# Patient Record
Sex: Female | Born: 1950 | Hispanic: No | Smoking: Former smoker
Health system: Southern US, Community
[De-identification: ages and names within clinical notes are randomized; demographics above are authoritative.]

## PROBLEM LIST (undated history)

## (undated) DIAGNOSIS — F32A Depression, unspecified: Secondary | ICD-10-CM

## (undated) DIAGNOSIS — G4730 Sleep apnea, unspecified: Secondary | ICD-10-CM

## (undated) DIAGNOSIS — E7800 Pure hypercholesterolemia, unspecified: Secondary | ICD-10-CM

## (undated) DIAGNOSIS — M1990 Unspecified osteoarthritis, unspecified site: Secondary | ICD-10-CM

## (undated) DIAGNOSIS — Z9889 Other specified postprocedural states: Secondary | ICD-10-CM

## (undated) DIAGNOSIS — I10 Essential (primary) hypertension: Secondary | ICD-10-CM

## (undated) HISTORY — PX: BREAST SURGERY: SHX581

## (undated) HISTORY — PX: TOTAL HIP ARTHROPLASTY: SHX124

## (undated) HISTORY — PX: MENISCUS REPAIR: SHX5179

## (undated) HISTORY — PX: TREATMENT FISTULA ANAL: SUR1390

## (undated) HISTORY — PX: APPENDECTOMY: SHX54

---

## 1999-11-27 ENCOUNTER — Other Ambulatory Visit: Admission: RE | Admit: 1999-11-27 | Discharge: 1999-11-27 | Payer: Self-pay | Admitting: Plastic Surgery

## 2017-03-31 DIAGNOSIS — Z01818 Encounter for other preprocedural examination: Secondary | ICD-10-CM

## 2020-01-04 ENCOUNTER — Other Ambulatory Visit: Payer: Self-pay | Admitting: Neurological Surgery

## 2020-01-11 ENCOUNTER — Encounter (HOSPITAL_COMMUNITY): Payer: Self-pay

## 2020-01-11 ENCOUNTER — Other Ambulatory Visit: Payer: Self-pay

## 2020-01-11 ENCOUNTER — Encounter (HOSPITAL_COMMUNITY)
Admission: RE | Admit: 2020-01-11 | Discharge: 2020-01-11 | Disposition: A | Discharge status: Home / Self Care | Payer: Medicare PPO | Source: Ambulatory Visit | Attending: Neurological Surgery | Admitting: Neurological Surgery

## 2020-01-11 DIAGNOSIS — Z01818 Encounter for other preprocedural examination: Secondary | ICD-10-CM | POA: Insufficient documentation

## 2020-01-11 HISTORY — DX: Sleep apnea, unspecified: G47.30

## 2020-01-11 HISTORY — DX: Depression, unspecified: F32.A

## 2020-01-11 HISTORY — DX: Unspecified osteoarthritis, unspecified site: M19.90

## 2020-01-11 HISTORY — DX: Other specified postprocedural states: Z98.890

## 2020-01-11 HISTORY — DX: Pure hypercholesterolemia, unspecified: E78.00

## 2020-01-11 HISTORY — DX: Essential (primary) hypertension: I10

## 2020-01-11 LAB — CBC
HCT: 41.8 % (ref 36.0–46.0)
Hemoglobin: 14.1 g/dL (ref 12.0–15.0)
MCH: 30.1 pg (ref 26.0–34.0)
MCHC: 33.7 g/dL (ref 30.0–36.0)
MCV: 89.1 fL (ref 80.0–100.0)
Platelets: 177 10*3/uL (ref 150–400)
RBC: 4.69 MIL/uL (ref 3.87–5.11)
RDW: 12.7 % (ref 11.5–15.5)
WBC: 7.9 10*3/uL (ref 4.0–10.5)
nRBC: 0 % (ref 0.0–0.2)

## 2020-01-11 LAB — TYPE AND SCREEN
ABO/RH(D): O POS
Antibody Screen: NEGATIVE

## 2020-01-11 LAB — BASIC METABOLIC PANEL
Anion gap: 10 (ref 5–15)
BUN: 22 mg/dL (ref 8–23)
CO2: 22 mmol/L (ref 22–32)
Calcium: 10 mg/dL (ref 8.9–10.3)
Chloride: 107 mmol/L (ref 98–111)
Creatinine, Ser: 0.64 mg/dL (ref 0.44–1.00)
GFR calc Af Amer: >60 mL/min (ref >60)
GFR calc non Af Amer: >60 mL/min (ref >60)
Glucose, Bld: 97 mg/dL (ref 70–99)
Potassium: 4.2 mmol/L (ref 3.5–5.1)
Sodium: 139 mmol/L (ref 135–145)

## 2020-01-11 LAB — SURGICAL PCR SCREEN
MRSA, PCR: NEGATIVE
Staphylococcus aureus: POSITIVE — AB

## 2020-01-11 NOTE — Progress Notes (Signed)
No Pharmacies Listed     Your procedure is scheduled on Monday, July 26th.  Report to [facility] Main Entrance "A" at 10:45 A.M., and check in at the Admitting office.  Call this number if you have problems the morning of surgery:  [phone]  Call [phone] if you have any questions prior to your surgery date Monday-Friday 8am-4pm    Remember:  Do not eat or drink after midnight the night before your surgery      Take these medicines the morning of surgery with A SIP OF WATER   Tylenol - if needed  Escitalopram (Lexapro)  Gabapentin (Neurontin)  Omeprazole (prilosec)   Rosuvastatin (Crestor)  As of today, STOP taking any Aspirin (unless otherwise instructed by your surgeon) Aleve, Naproxen, Ibuprofen, Motrin, Advil, Goody's, BC's, all herbal medications, fish oil, and all vitamins.                      Do not wear jewelry, make up, or nail polish            Do not wear lotions, powders, perfumes, or deodorant.            Do not shave 48 hours prior to surgery.              Do not bring valuables to the hospital.            [facility] is not responsible for any belongings or valuables.  Do NOT Smoke (Tobacco/Vaping) or drink Alcohol 24 hours prior to your procedure If you use a CPAP at night, you may bring all equipment for your overnight stay.   Contacts, glasses, dentures or bridgework may not be worn into surgery.      For patients admitted to the hospital, discharge time will be determined by your treatment team.   Patients discharged the day of surgery will not be allowed to drive home, and someone needs to stay with them for 24 hours.    Special instructions:   [facility]- Preparing For Surgery  Before surgery, you can play an important role. Because skin is not sterile, your skin needs to be as free of germs as possible. You can reduce the number of germs on your skin by washing with CHG (chlorahexidine gluconate) Soap before surgery.  CHG is an antiseptic  cleaner which kills germs and bonds with the skin to continue killing germs even after washing.    Oral Hygiene is also important to reduce your risk of infection.  Remember - BRUSH YOUR TEETH THE MORNING OF SURGERY WITH YOUR REGULAR TOOTHPASTE  Please do not use if you have an allergy to CHG or antibacterial soaps. If your skin becomes reddened/irritated stop using the CHG.  Do not shave (including legs and underarms) for at least 48 hours prior to first CHG shower. It is OK to shave your face.  Please follow these instructions carefully.   1. Shower the NIGHT BEFORE SURGERY and the MORNING OF SURGERY with CHG Soap.   2. If you chose to wash your hair, wash your hair first as usual with your normal shampoo.  3. After you shampoo, rinse your hair and body thoroughly to remove the shampoo.  4. Use CHG as you would any other liquid soap. You can apply CHG directly to the skin and wash gently with a scrungie or a clean washcloth.   5. Apply the CHG Soap to your body ONLY FROM THE NECK DOWN.  Do not  use on open wounds or open sores. Avoid contact with your eyes, ears, mouth and genitals (private parts). Wash Face and genitals (private parts)  with your normal soap.   6. Wash thoroughly, paying special attention to the area where your surgery will be performed.  7. Thoroughly rinse your body with warm water from the neck down.  8. DO NOT shower/wash with your normal soap after using and rinsing off the CHG Soap.  9. Pat yourself dry with a CLEAN TOWEL.  10. Wear CLEAN PAJAMAS to bed the night before surgery  11. Place CLEAN SHEETS on your bed the night of your first shower and DO NOT SLEEP WITH PETS.   Day of Surgery: Wear Clean/Comfortable clothing the morning of surgery Do not apply any deodorants/lotions.   Remember to brush your teeth WITH YOUR REGULAR TOOTHPASTE.   Please read over the following fact sheets that you were given.

## 2020-01-11 NOTE — Progress Notes (Addendum)
PCP - Mauricio Po  Chest x-ray - n/a EKG - 01-11-20  SA - yes, does not wear CPAP   COVID TEST- Saturday 01-17-20   Anesthesia review: yes, EKG Revonda Standard notified at PAT appointment, chart sent to anesthesia  Patient denies shortness of breath, fever, cough and chest pain at PAT appointment   All instructions explained to the patient, with a verbal understanding of the material. Patient agrees to go over the instructions while at home for a better understanding. Patient also instructed to self quarantine after being tested for COVID-19. The opportunity to ask questions was provided.

## 2020-01-12 NOTE — Anesthesia Preprocedure Evaluation (Addendum)
Anesthesia Evaluation  Patient identified by MRN, date of birth, ID band Patient awake    Airway Mallampati: I  TM Distance: >3 FB     Dental  (+) Missing, Dental Advisory Given   Pulmonary sleep apnea , former smoker,    breath sounds clear to auscultation       Cardiovascular hypertension,  Rhythm:Regular     Neuro/Psych Depression    GI/Hepatic negative GI ROS, Neg liver ROS,   Endo/Other    Renal/GU negative Renal ROS     Musculoskeletal   Abdominal   Peds  Hematology   Anesthesia Other Findings   Reproductive/Obstetrics                          Anesthesia Physical Anesthesia Plan  ASA: III  Anesthesia Plan: General   Post-op Pain Management:    Induction:   PONV Risk Score and Plan: Ondansetron, Dexamethasone and Treatment may vary due to age or medical condition  Airway Management Planned: Oral ETT  Additional Equipment:   Intra-op Plan:   Post-operative Plan: Extubation in OR  Informed Consent: I have reviewed the patients History and Physical, chart, labs and discussed the procedure including the risks, benefits and alternatives for the proposed anesthesia with the patient or authorized representative who has indicated his/her understanding and acceptance.     Dental advisory given  Plan Discussed with: CRNA and Anesthesiologist  Anesthesia Plan Comments: (PAT note written 01/12/2020 by Shonna Chock, PA-C. )      Anesthesia Quick Evaluation

## 2020-01-12 NOTE — Progress Notes (Signed)
Anesthesia Chart Review:  Case: [id number] Date/Time: 01/17/20 1232   Procedure: Cervical 4-5 Cervical 5-6 Cervical 6-7 Anterior cervical decompression/discectomy/fusion (N/A ) - 3C   Anesthesia type: General   Pre-op diagnosis: Cervical myelopathy   [facility]: [facility] ROOM 21 / [facility] OR   Surgeons: Jadene Pierini, MD      DISCUSSION: Patient is a 69 year old female scheduled for the above procedure.  History includes former smoker (quit 06/25/19), HTN, OSA (does not use CPAP), hypercholesterolemia, depression. BMI is consistent with morbid obesity.  She denies shortness of breath, cough, fever, chest pain and PAT RN visit.  Presurgical COVID-19 test is scheduled for 01/15/2020.  Anesthesia team to evaluate on the day of surgery.   VS: BP 133/72   Pulse 68   Temp 37.3 C (Oral)   Resp 18   Ht 5\' 7"  (1.702 m)   Wt 120.3 kg   SpO2 96%   BMI 41.52 kg/m   PROVIDERS: York, Regina F, NP is PCP   LABS: Labs reviewed: Acceptable for surgery. (all labs ordered are listed, but only abnormal results are displayed)  Labs Reviewed  SURGICAL PCR SCREEN - Abnormal; Notable for the following components:      Result Value   Staphylococcus aureus POSITIVE (*)    All other components within normal limits  BASIC METABOLIC PANEL  CBC  TYPE AND SCREEN     EKG: 01/11/20:  Sinus bradycardia at 57 bpm Low voltage QRS Incomplete right bundle branch block Borderline ECG Confirmed by 01/13/20 [phone]) on 01/11/2020 5:18:49 PM   CV: N/A   Past Medical History:  Diagnosis Date  . Arthritis   . Depression   . High cholesterol   . History of bladder surgery    Bladder Sling  . Hypertension   . Sleep apnea     Past Surgical History:  Procedure Laterality Date  . APPENDECTOMY    . BREAST SURGERY     Breast Reduction (bilateral)  . CESAREAN SECTION     (4) C-sections  . MENISCUS REPAIR Left   . TOTAL HIP ARTHROPLASTY Right   . TREATMENT FISTULA ANAL      MEDICATIONS: .  acetaminophen (TYLENOL) 500 MG tablet  . escitalopram (LEXAPRO) 20 MG tablet  . gabapentin (NEURONTIN) 300 MG capsule  . hydrochlorothiazide (HYDRODIURIL) 25 MG tablet  . meloxicam (MOBIC) 15 MG tablet  . omeprazole (PRILOSEC) 40 MG capsule  . oxybutynin (DITROPAN) 5 MG tablet  . Pseudoeph-Doxylamine-DM-APAP (NYQUIL PO)  . rosuvastatin (CRESTOR) 40 MG tablet   No current facility-administered medications for this encounter.  Mobic is on hold.  01/13/2020, PA-C Surgical Short Stay/Anesthesiology [facility] Phone [phone] [facility] Phone [phone] 01/12/2020 2:07 PM

## 2020-01-14 MED ORDER — DEXTROSE 5 % IV SOLN
3.0000 g | INTRAVENOUS | Status: AC
  Administered 2020-01-17: 3 g via INTRAVENOUS
  Filled 2020-01-14: qty 3

## 2020-01-15 ENCOUNTER — Other Ambulatory Visit (HOSPITAL_COMMUNITY)
Admission: RE | Admit: 2020-01-15 | Discharge: 2020-01-15 | Disposition: A | Discharge status: Home / Self Care | Payer: Medicare PPO | Source: Ambulatory Visit | Attending: Neurological Surgery | Admitting: Neurological Surgery

## 2020-01-15 DIAGNOSIS — Z20822 Contact with and (suspected) exposure to covid-19: Secondary | ICD-10-CM | POA: Insufficient documentation

## 2020-01-15 DIAGNOSIS — Z01812 Encounter for preprocedural laboratory examination: Secondary | ICD-10-CM | POA: Insufficient documentation

## 2020-01-15 LAB — SARS CORONAVIRUS 2 (TAT 6-24 HRS): SARS Coronavirus 2: NEGATIVE

## 2020-01-17 ENCOUNTER — Ambulatory Visit (HOSPITAL_COMMUNITY): Payer: Medicare PPO | Admitting: Vascular Surgery

## 2020-01-17 ENCOUNTER — Encounter (HOSPITAL_COMMUNITY)
Admission: RE | Disposition: A | Discharge status: Home / Self Care | Payer: Self-pay | Source: Home / Self Care | Attending: Neurological Surgery

## 2020-01-17 ENCOUNTER — Ambulatory Visit (HOSPITAL_COMMUNITY): Payer: Medicare PPO

## 2020-01-17 ENCOUNTER — Other Ambulatory Visit: Payer: Self-pay

## 2020-01-17 ENCOUNTER — Encounter (HOSPITAL_COMMUNITY): Payer: Self-pay | Admitting: Neurological Surgery

## 2020-01-17 ENCOUNTER — Ambulatory Visit (HOSPITAL_COMMUNITY): Payer: Medicare PPO | Admitting: Certified Registered Nurse Anesthetist

## 2020-01-17 ENCOUNTER — Observation Stay (HOSPITAL_COMMUNITY)
Admission: RE | Admit: 2020-01-17 | Discharge: 2020-01-18 | Disposition: A | Discharge status: Home / Self Care | Payer: Medicare PPO | Attending: Neurological Surgery | Admitting: Neurological Surgery

## 2020-01-17 DIAGNOSIS — I10 Essential (primary) hypertension: Secondary | ICD-10-CM | POA: Insufficient documentation

## 2020-01-17 DIAGNOSIS — Z87891 Personal history of nicotine dependence: Secondary | ICD-10-CM | POA: Insufficient documentation

## 2020-01-17 DIAGNOSIS — M5000 Cervical disc disorder with myelopathy, unspecified cervical region: Secondary | ICD-10-CM | POA: Diagnosis not present

## 2020-01-17 DIAGNOSIS — Z20822 Contact with and (suspected) exposure to covid-19: Secondary | ICD-10-CM | POA: Diagnosis not present

## 2020-01-17 DIAGNOSIS — Z419 Encounter for procedure for purposes other than remedying health state, unspecified: Secondary | ICD-10-CM

## 2020-01-17 DIAGNOSIS — M5412 Radiculopathy, cervical region: Principal | ICD-10-CM | POA: Insufficient documentation

## 2020-01-17 DIAGNOSIS — G959 Disease of spinal cord, unspecified: Secondary | ICD-10-CM | POA: Diagnosis present

## 2020-01-17 HISTORY — PX: ANTERIOR CERVICAL DECOMP/DISCECTOMY FUSION: SHX1161

## 2020-01-17 LAB — ABO/RH: ABO/RH(D): O POS

## 2020-01-17 SURGERY — ANTERIOR CERVICAL DECOMPRESSION/DISCECTOMY FUSION 3 LEVELS
Anesthesia: General | Site: Spine Cervical

## 2020-01-17 MED ORDER — MENTHOL 3 MG MT LOZG: 1.0000 | LOZENGE | OROMUCOSAL | Status: DC | PRN

## 2020-01-17 MED ORDER — FENTANYL CITRATE (PF) 100 MCG/2ML IJ SOLN: 25.0000 ug | INTRAMUSCULAR | Status: DC | PRN

## 2020-01-17 MED ORDER — PHENYLEPHRINE HCL-NACL 10-0.9 MG/250ML-% IV SOLN
INTRAVENOUS | Status: DC | PRN
  Administered 2020-01-17: 50 ug/min via INTRAVENOUS
  Administered 2020-01-17: 25 ug/min via INTRAVENOUS

## 2020-01-17 MED ORDER — THROMBIN 5000 UNITS EX SOLR
CUTANEOUS | Status: AC
  Filled 2020-01-17: qty 5000

## 2020-01-17 MED ORDER — PHENOL 1.4 % MT LIQD: 1.0000 | OROMUCOSAL | Status: DC | PRN

## 2020-01-17 MED ORDER — PROPOFOL 10 MG/ML IV BOLUS
INTRAVENOUS | Status: DC | PRN
  Administered 2020-01-17: 30 mg via INTRAVENOUS
  Administered 2020-01-17: 170 mg via INTRAVENOUS
  Administered 2020-01-17: 20 mg via INTRAVENOUS

## 2020-01-17 MED ORDER — OXYCODONE HCL 5 MG PO TABS: 5.0000 mg | ORAL_TABLET | ORAL | Status: DC | PRN

## 2020-01-17 MED ORDER — SODIUM CHLORIDE 0.9% FLUSH: 3.0000 mL | INTRAVENOUS | Status: DC | PRN

## 2020-01-17 MED ORDER — PROPOFOL 10 MG/ML IV BOLUS
INTRAVENOUS | Status: AC
  Filled 2020-01-17: qty 20

## 2020-01-17 MED ORDER — PHENYLEPHRINE HCL-NACL 10-0.9 MG/250ML-% IV SOLN
INTRAVENOUS | Status: AC
  Filled 2020-01-17: qty 250

## 2020-01-17 MED ORDER — CHLORHEXIDINE GLUCONATE 0.12 % MT SOLN
OROMUCOSAL | Status: AC
  Administered 2020-01-17: 15 mL via OROMUCOSAL
  Filled 2020-01-17: qty 15

## 2020-01-17 MED ORDER — OXYCODONE HCL 5 MG PO TABS
ORAL_TABLET | ORAL | Status: AC
  Filled 2020-01-17: qty 2

## 2020-01-17 MED ORDER — 0.9 % SODIUM CHLORIDE (POUR BTL) OPTIME
TOPICAL | Status: DC | PRN
  Administered 2020-01-17: 1000 mL

## 2020-01-17 MED ORDER — ACETAMINOPHEN 650 MG RE SUPP: 650.0000 mg | RECTAL | Status: DC | PRN

## 2020-01-17 MED ORDER — CHLORHEXIDINE GLUCONATE CLOTH 2 % EX PADS: 6.0000 | MEDICATED_PAD | Freq: Once | CUTANEOUS | Status: DC

## 2020-01-17 MED ORDER — SODIUM CHLORIDE 0.9% FLUSH: 3.0000 mL | Freq: Two times a day (BID) | INTRAVENOUS | Status: DC

## 2020-01-17 MED ORDER — MIDAZOLAM HCL 5 MG/5ML IJ SOLN
INTRAMUSCULAR | Status: DC | PRN
  Administered 2020-01-17: 2 mg via INTRAVENOUS

## 2020-01-17 MED ORDER — MIDAZOLAM HCL 2 MG/2ML IJ SOLN
INTRAMUSCULAR | Status: AC
  Filled 2020-01-17: qty 2

## 2020-01-17 MED ORDER — ROCURONIUM BROMIDE 10 MG/ML (PF) SYRINGE
PREFILLED_SYRINGE | INTRAVENOUS | Status: DC | PRN
  Administered 2020-01-17: 10 mg via INTRAVENOUS
  Administered 2020-01-17 (×2): 20 mg via INTRAVENOUS
  Administered 2020-01-17: 10 mg via INTRAVENOUS
  Administered 2020-01-17 (×2): 20 mg via INTRAVENOUS
  Administered 2020-01-17: 30 mg via INTRAVENOUS

## 2020-01-17 MED ORDER — SUCCINYLCHOLINE CHLORIDE 200 MG/10ML IV SOSY
PREFILLED_SYRINGE | INTRAVENOUS | Status: DC | PRN
Start: 2020-01-17 — End: 2020-01-17
  Administered 2020-01-17: 140 mg via INTRAVENOUS

## 2020-01-17 MED ORDER — ACETAMINOPHEN 325 MG PO TABS: 650.0000 mg | ORAL_TABLET | ORAL | Status: DC | PRN

## 2020-01-17 MED ORDER — LACTATED RINGERS IV SOLN: INTRAVENOUS | Status: DC | PRN

## 2020-01-17 MED ORDER — CYCLOBENZAPRINE HCL 10 MG PO TABS
ORAL_TABLET | ORAL | Status: AC
  Filled 2020-01-17: qty 1

## 2020-01-17 MED ORDER — DOCUSATE SODIUM 100 MG PO CAPS
100.0000 mg | ORAL_CAPSULE | Freq: Two times a day (BID) | ORAL | Status: DC
  Administered 2020-01-17: 100 mg via ORAL
  Filled 2020-01-17: qty 1

## 2020-01-17 MED ORDER — OXYBUTYNIN CHLORIDE 5 MG PO TABS
10.0000 mg | ORAL_TABLET | Freq: Two times a day (BID) | ORAL | Status: DC
  Administered 2020-01-17: 10 mg via ORAL
  Filled 2020-01-17 (×2): qty 2

## 2020-01-17 MED ORDER — ONDANSETRON HCL 4 MG/2ML IJ SOLN
INTRAMUSCULAR | Status: DC | PRN
  Administered 2020-01-17: 4 mg via INTRAVENOUS

## 2020-01-17 MED ORDER — LIDOCAINE 2% (20 MG/ML) 5 ML SYRINGE
INTRAMUSCULAR | Status: DC | PRN
  Administered 2020-01-17: 100 mg via INTRAVENOUS

## 2020-01-17 MED ORDER — SODIUM CHLORIDE 0.9 % IV SOLN
INTRAVENOUS | Status: DC | PRN
  Administered 2020-01-17: 500 mL

## 2020-01-17 MED ORDER — POLYETHYLENE GLYCOL 3350 17 G PO PACK: 17.0000 g | PACK | Freq: Every day | ORAL | Status: DC | PRN

## 2020-01-17 MED ORDER — SODIUM CHLORIDE 0.9 % IV SOLN
250.0000 mL | INTRAVENOUS | Status: DC
  Administered 2020-01-17: 250 mL via INTRAVENOUS

## 2020-01-17 MED ORDER — CYCLOBENZAPRINE HCL 10 MG PO TABS
10.0000 mg | ORAL_TABLET | Freq: Three times a day (TID) | ORAL | Status: DC | PRN
  Administered 2020-01-17: 10 mg via ORAL
  Filled 2020-01-17: qty 1

## 2020-01-17 MED ORDER — ONDANSETRON HCL 4 MG PO TABS: 4.0000 mg | ORAL_TABLET | Freq: Four times a day (QID) | ORAL | Status: DC | PRN

## 2020-01-17 MED ORDER — ROSUVASTATIN CALCIUM 20 MG PO TABS
40.0000 mg | ORAL_TABLET | Freq: Every day | ORAL | Status: DC
  Administered 2020-01-17: 40 mg via ORAL
  Filled 2020-01-17: qty 2

## 2020-01-17 MED ORDER — LACTATED RINGERS IV SOLN: INTRAVENOUS | Status: DC

## 2020-01-17 MED ORDER — FENTANYL CITRATE (PF) 250 MCG/5ML IJ SOLN
INTRAMUSCULAR | Status: AC
  Filled 2020-01-17: qty 5

## 2020-01-17 MED ORDER — ORAL CARE MOUTH RINSE: 15.0000 mL | Freq: Once | OROMUCOSAL | Status: AC

## 2020-01-17 MED ORDER — HYDROMORPHONE HCL 1 MG/ML IJ SOLN: 1.0000 mg | INTRAMUSCULAR | Status: DC | PRN

## 2020-01-17 MED ORDER — CHLORHEXIDINE GLUCONATE 0.12 % MT SOLN: 15.0000 mL | Freq: Once | OROMUCOSAL | Status: AC

## 2020-01-17 MED ORDER — OXYCODONE HCL 5 MG PO TABS
10.0000 mg | ORAL_TABLET | ORAL | Status: DC | PRN
  Administered 2020-01-17 – 2020-01-18 (×3): 10 mg via ORAL
  Filled 2020-01-17 (×2): qty 2

## 2020-01-17 MED ORDER — ROCURONIUM BROMIDE 10 MG/ML (PF) SYRINGE
PREFILLED_SYRINGE | INTRAVENOUS | Status: AC
  Filled 2020-01-17: qty 10

## 2020-01-17 MED ORDER — SUGAMMADEX SODIUM 200 MG/2ML IV SOLN
INTRAVENOUS | Status: DC | PRN
  Administered 2020-01-17: 200 mg via INTRAVENOUS
  Administered 2020-01-17: 50 mg via INTRAVENOUS

## 2020-01-17 MED ORDER — ESCITALOPRAM OXALATE 20 MG PO TABS
20.0000 mg | ORAL_TABLET | Freq: Every day | ORAL | Status: DC
  Administered 2020-01-17: 20 mg via ORAL
  Filled 2020-01-17 (×2): qty 1

## 2020-01-17 MED ORDER — CEFAZOLIN SODIUM-DEXTROSE 2-4 GM/100ML-% IV SOLN
2.0000 g | Freq: Three times a day (TID) | INTRAVENOUS | Status: AC
  Administered 2020-01-17 – 2020-01-18 (×2): 2 g via INTRAVENOUS
  Filled 2020-01-17 (×2): qty 100

## 2020-01-17 MED ORDER — DEXAMETHASONE SODIUM PHOSPHATE 10 MG/ML IJ SOLN
INTRAMUSCULAR | Status: DC | PRN
  Administered 2020-01-17: 10 mg via INTRAVENOUS

## 2020-01-17 MED ORDER — SUCCINYLCHOLINE CHLORIDE 200 MG/10ML IV SOSY
PREFILLED_SYRINGE | INTRAVENOUS | Status: AC
  Filled 2020-01-17: qty 10

## 2020-01-17 MED ORDER — GABAPENTIN 300 MG PO CAPS
300.0000 mg | ORAL_CAPSULE | Freq: Two times a day (BID) | ORAL | Status: DC
  Administered 2020-01-17: 300 mg via ORAL
  Filled 2020-01-17: qty 1

## 2020-01-17 MED ORDER — LIDOCAINE 2% (20 MG/ML) 5 ML SYRINGE
INTRAMUSCULAR | Status: AC
  Filled 2020-01-17: qty 5

## 2020-01-17 MED ORDER — HYDROCHLOROTHIAZIDE 25 MG PO TABS
25.0000 mg | ORAL_TABLET | Freq: Every day | ORAL | Status: DC
  Administered 2020-01-17: 25 mg via ORAL
  Filled 2020-01-17: qty 1

## 2020-01-17 MED ORDER — ONDANSETRON HCL 4 MG/2ML IJ SOLN
4.0000 mg | Freq: Four times a day (QID) | INTRAMUSCULAR | Status: DC | PRN
  Administered 2020-01-17: 4 mg via INTRAVENOUS
  Filled 2020-01-17: qty 2

## 2020-01-17 MED ORDER — THROMBIN 5000 UNITS EX SOLR
OROMUCOSAL | Status: DC | PRN
  Administered 2020-01-17: 5 mL via TOPICAL

## 2020-01-17 MED ORDER — PHENYLEPHRINE 40 MCG/ML (10ML) SYRINGE FOR IV PUSH (FOR BLOOD PRESSURE SUPPORT)
PREFILLED_SYRINGE | INTRAVENOUS | Status: DC | PRN
  Administered 2020-01-17 (×2): 80 ug via INTRAVENOUS

## 2020-01-17 MED ORDER — LIDOCAINE-EPINEPHRINE 1 %-1:100000 IJ SOLN
INTRAMUSCULAR | Status: DC | PRN
  Administered 2020-01-17: 10 mL

## 2020-01-17 MED ORDER — PANTOPRAZOLE SODIUM 40 MG PO TBEC
40.0000 mg | DELAYED_RELEASE_TABLET | Freq: Every day | ORAL | Status: DC
  Administered 2020-01-17: 40 mg via ORAL
  Filled 2020-01-17: qty 1

## 2020-01-17 MED ORDER — FENTANYL CITRATE (PF) 250 MCG/5ML IJ SOLN
INTRAMUSCULAR | Status: DC | PRN
  Administered 2020-01-17: 150 ug via INTRAVENOUS
  Administered 2020-01-17 (×3): 50 ug via INTRAVENOUS

## 2020-01-17 MED ORDER — HYDROXYZINE HCL 50 MG/ML IM SOLN: 50.0000 mg | Freq: Four times a day (QID) | INTRAMUSCULAR | Status: DC | PRN

## 2020-01-17 MED ORDER — LIDOCAINE-EPINEPHRINE 1 %-1:100000 IJ SOLN
INTRAMUSCULAR | Status: AC
  Filled 2020-01-17: qty 1

## 2020-01-17 SURGICAL SUPPLY — 64 items
ADH SKN CLS APL DERMABOND .7 (GAUZE/BANDAGES/DRESSINGS) ×1
APL SKNCLS STERI-STRIP NONHPOA (GAUZE/BANDAGES/DRESSINGS)
BAG DECANTER FOR FLEXI CONT (MISCELLANEOUS) ×3 IMPLANT
BAND INSRT 18 STRL LF DISP RB (MISCELLANEOUS) ×2
BAND RUBBER #18 3X1/16 STRL (MISCELLANEOUS) ×6 IMPLANT
BENZOIN TINCTURE PRP APPL 2/3 (GAUZE/BANDAGES/DRESSINGS) IMPLANT
BLADE CLIPPER SURG (BLADE) IMPLANT
BLADE SURG 11 STRL SS (BLADE) ×3 IMPLANT
BUR MATCHSTICK NEURO 3.0 LAGG (BURR) ×3 IMPLANT
CANISTER SUCT 3000ML PPV (MISCELLANEOUS) ×3 IMPLANT
COVER WAND RF STERILE (DRAPES) ×3 IMPLANT
DECANTER SPIKE VIAL GLASS SM (MISCELLANEOUS) ×3 IMPLANT
DERMABOND ADVANCED (GAUZE/BANDAGES/DRESSINGS) ×2
DERMABOND ADVANCED .7 DNX12 (GAUZE/BANDAGES/DRESSINGS) ×1 IMPLANT
DRAPE C-ARM 42X72 X-RAY (DRAPES) ×6 IMPLANT
DRAPE HALF SHEET 40X57 (DRAPES) IMPLANT
DRAPE LAPAROTOMY 100X72 PEDS (DRAPES) ×3 IMPLANT
DRAPE MICROSCOPE LEICA (MISCELLANEOUS) ×3 IMPLANT
DURAPREP 6ML APPLICATOR 50/CS (WOUND CARE) ×3 IMPLANT
ELECT COATED BLADE 2.86 ST (ELECTRODE) ×3 IMPLANT
ELECT REM PT RETURN 9FT ADLT (ELECTROSURGICAL) ×3
ELECTRODE REM PT RTRN 9FT ADLT (ELECTROSURGICAL) ×1 IMPLANT
GAUZE 4X4 16PLY RFD (DISPOSABLE) IMPLANT
GLOVE BIO SURGEON STRL SZ 6.5 (GLOVE) ×1 IMPLANT
GLOVE BIO SURGEON STRL SZ7.5 (GLOVE) ×5 IMPLANT
GLOVE BIO SURGEON STRL SZ8 (GLOVE) ×2 IMPLANT
GLOVE BIO SURGEON STRL SZ8.5 (GLOVE) ×2 IMPLANT
GLOVE BIO SURGEONS STRL SZ 6.5 (GLOVE) ×1
GLOVE BIOGEL PI IND STRL 6.5 (GLOVE) IMPLANT
GLOVE BIOGEL PI IND STRL 7.5 (GLOVE) ×2 IMPLANT
GLOVE BIOGEL PI INDICATOR 6.5 (GLOVE) ×6
GLOVE BIOGEL PI INDICATOR 7.5 (GLOVE) ×4
GLOVE EXAM NITRILE LRG STRL (GLOVE) IMPLANT
GLOVE EXAM NITRILE XL STR (GLOVE) IMPLANT
GLOVE EXAM NITRILE XS STR PU (GLOVE) IMPLANT
GLOVE SURG SS PI 6.0 STRL IVOR (GLOVE) ×8 IMPLANT
GOWN STRL REUS W/ TWL LRG LVL3 (GOWN DISPOSABLE) ×2 IMPLANT
GOWN STRL REUS W/ TWL XL LVL3 (GOWN DISPOSABLE) IMPLANT
GOWN STRL REUS W/TWL 2XL LVL3 (GOWN DISPOSABLE) IMPLANT
GOWN STRL REUS W/TWL LRG LVL3 (GOWN DISPOSABLE) ×9
GOWN STRL REUS W/TWL XL LVL3 (GOWN DISPOSABLE) ×6
HEMOSTAT POWDER KIT SURGIFOAM (HEMOSTASIS) ×3 IMPLANT
KIT BASIN OR (CUSTOM PROCEDURE TRAY) ×3 IMPLANT
KIT TURNOVER KIT B (KITS) ×3 IMPLANT
NDL SPNL 18GX3.5 QUINCKE PK (NEEDLE) ×1 IMPLANT
NEEDLE HYPO 22GX1.5 SAFETY (NEEDLE) ×3 IMPLANT
NEEDLE SPNL 18GX3.5 QUINCKE PK (NEEDLE) ×3 IMPLANT
NS IRRIG 1000ML POUR BTL (IV SOLUTION) ×3 IMPLANT
PACK LAMINECTOMY NEURO (CUSTOM PROCEDURE TRAY) ×3 IMPLANT
PAD ARMBOARD 7.5X6 YLW CONV (MISCELLANEOUS) ×9 IMPLANT
PIN DISTRACTION 14MM (PIN) ×4 IMPLANT
PLATE ANT CERV ATL ELITE 57.5 (Plate) ×2 IMPLANT
SCREW SELF TAP VAR 4.0X13 (Screw) ×16 IMPLANT
SPACER BONE CORNERSTONE 6X14 (Orthopedic Implant) ×4 IMPLANT
SPACER BONE CORNERSTONE 8X14 (Orthopedic Implant) ×2 IMPLANT
SPONGE INTESTINAL PEANUT (DISPOSABLE) ×3 IMPLANT
STAPLER VISISTAT 35W (STAPLE) ×2 IMPLANT
SUT MNCRL AB 3-0 PS2 18 (SUTURE) ×3 IMPLANT
SUT VIC AB 3-0 SH 8-18 (SUTURE) ×3 IMPLANT
TAPE CLOTH 3X10 TAN LF (GAUZE/BANDAGES/DRESSINGS) ×3 IMPLANT
TOWEL GREEN STERILE (TOWEL DISPOSABLE) ×3 IMPLANT
TOWEL GREEN STERILE FF (TOWEL DISPOSABLE) ×3 IMPLANT
TRAY FOLEY MTR SLVR 16FR STAT (SET/KITS/TRAYS/PACK) ×3 IMPLANT
WATER STERILE IRR 1000ML POUR (IV SOLUTION) ×3 IMPLANT

## 2020-01-17 NOTE — Progress Notes (Signed)
Orthopedic Tech Progress Note Patient Details:  Jacqueline Spencer [birth year] [id number]  Ortho Devices Type of Ortho Device: Soft collar Ortho Device/Splint Interventions: Ordered, Application, Adjustment   Post Interventions Patient Tolerated: Well Instructions Provided: Care of device, Adjustment of device   Trinna Post 01/17/2020, 8:46 PM

## 2020-01-17 NOTE — Anesthesia Postprocedure Evaluation (Signed)
Anesthesia Post Note  Patient: Jacqueline Spencer  Procedure(s) Performed: Cervical four-five Cervical five-six Cervical six-seven Anterior cervical decompression/discectomy/fusion (N/A Spine Cervical)     Patient location during evaluation: PACU Anesthesia Type: General Level of consciousness: awake Pain management: pain level controlled Vital Signs Assessment: post-procedure vital signs reviewed and stable Respiratory status: spontaneous breathing Cardiovascular status: stable Postop Assessment: no apparent nausea or vomiting Anesthetic complications: no   No complications documented.  Last Vitals:  Vitals:   01/17/20 1645 01/17/20 1705  BP: (!) 144/88 (!) 142/75  Pulse: 70 68  Resp: 15 18  Temp: 37.2 C 36.5 C  SpO2: 95% 95%    Last Pain:  Vitals:   01/17/20 1705  TempSrc: Oral  PainSc:                  Ronnica Dreese

## 2020-01-17 NOTE — H&P (Signed)
Surgical H&P Update  HPI: 69 y.o. woman with cervical radiculopathy and myelopathy, here for 3 level ACDF. Initial symptoms consisted of bilateral upper extremity pain, numbness, and weakness as well as difficulty with fine motor function and worsening gait. No changes in health since she was last seen. Still having symptoms and wishes to proceed with surgery.  PMHx:  Past Medical History:  Diagnosis Date  . Arthritis   . Depression   . High cholesterol   . History of bladder surgery    Bladder Sling  . Hypertension   . Sleep apnea    FamHx: History reviewed. No pertinent family history. SocHx:  reports that she quit smoking about 6 months ago. She has never used smokeless tobacco. She reports current alcohol use. She reports that she does not use drugs.  Physical Exam: Decreased sensation in BUE diffusely, +hoffman's, no clonus, 4+/5 in BUE, 5/5 in BLE  Assesment/Plan: 69 y.o. woman with cervical myelopathy and radiculopathy, here for 3 level ACDF. Risks, benefits, and alternatives discussed and the patient would like to continue with surgery.  -OR today -3C post-op  Jadene Pierini, MD 01/17/20 10:56 AM

## 2020-01-17 NOTE — Anesthesia Procedure Notes (Signed)
Procedure Name: Intubation Date/Time: 01/17/2020 11:15 AM Performed by: Harden Mo, CRNA Pre-anesthesia Checklist: Patient identified, Emergency Drugs available, Suction available and Patient being monitored Patient Re-evaluated:Patient Re-evaluated prior to induction Oxygen Delivery Method: Circle System Utilized Preoxygenation: Pre-oxygenation with 100% oxygen Induction Type: IV induction Ventilation: Mask ventilation without difficulty and Oral airway inserted - appropriate to patient size Laryngoscope Size: Mac, 3 and Glidescope Grade View: Grade I Tube type: Oral Number of attempts: 1 Airway Equipment and Method: Oral airway,  Video-laryngoscopy and Rigid stylet Placement Confirmation: ETT inserted through vocal cords under direct vision,  positive ETCO2 and breath sounds checked- equal and bilateral Secured at: 22 cm Tube secured with: Tape Dental Injury: Teeth and Oropharynx as per pre-operative assessment  Comments: Intubation performed by Champ Mungo, SRNA.

## 2020-01-17 NOTE — Brief Op Note (Signed)
01/17/2020  3:04 PM  PATIENT:  Shaune Pascal  69 y.o. female  PRE-OPERATIVE DIAGNOSIS:  Cervical myelopathy  POST-OPERATIVE DIAGNOSIS:  Cervical myelopathy  PROCEDURE:  Procedure(s): Cervical four-five Cervical five-six Cervical six-seven Anterior cervical decompression/discectomy/fusion (N/A)  SURGEON:  Surgeon(s) and Role:    * Jadene Pierini, MD - Primary    * Tressie Stalker, MD - Assisting  PHYSICIAN ASSISTANT:   ANESTHESIA:   general  EBL:  200 mL   BLOOD ADMINISTERED:none  DRAINS: none   LOCAL MEDICATIONS USED:  LIDOCAINE   SPECIMEN:  No Specimen  DISPOSITION OF SPECIMEN:  N/A  COUNTS:  YES  TOURNIQUET:  * No tourniquets in log *  DICTATION: .Note written in EPIC  PLAN OF CARE: Admit for overnight observation  PATIENT DISPOSITION:  PACU - hemodynamically stable.   Delay start of Pharmacological VTE agent (>24hrs) due to surgical blood loss or risk of bleeding: yes

## 2020-01-17 NOTE — Transfer of Care (Signed)
Immediate Anesthesia Transfer of Care Note  Patient: Jacqueline Spencer  Procedure(s) Performed: Cervical four-five Cervical five-six Cervical six-seven Anterior cervical decompression/discectomy/fusion (N/A Spine Cervical)  Patient Location: PACU  Anesthesia Type:General  Level of Consciousness: drowsy  Airway & Oxygen Therapy: Patient Spontanous Breathing and Patient connected to nasal cannula oxygen  Post-op Assessment: Report given to RN and Post -op Vital signs reviewed and stable  Post vital signs: Reviewed and stable  Last Vitals:  Vitals Value Taken Time  BP 147/72 01/17/20 1510  Temp    Pulse 80 01/17/20 1518  Resp 15 01/17/20 1518  SpO2 97 % 01/17/20 1518  Vitals shown include unvalidated device data.  Last Pain:  Vitals:   01/17/20 1031  TempSrc:   PainSc: 0-No pain      Patients Stated Pain Goal: 2 (01/17/20 1031)  Complications: No complications documented.

## 2020-01-17 NOTE — Op Note (Signed)
PATIENT: Jacqueline Spencer  PROCEDURE DATE: 01/17/20  PRE-OPERATIVE DIAGNOSIS:  Cervical radiculopathy, cervical myelopathy   POST-OPERATIVE DIAGNOSIS:  Same   PROCEDURE:  C4-C5, C5-6, C6-7 Anterior Cervical Discectomy and Instrumented Fusion   SURGEON:  Surgeon(s) and Role:    Jadene Pierini, MD - Primary    Tressie Stalker, MD - Assisting   ANESTHESIA: ETGA   BRIEF HISTORY: This is a 69 year old woman who presented with bilateral upper extremity numbness, radicular pain, worsening gait, and worsening fine motor function in the BUE. The patient was found to have severe cervical stenosis with cord signal change as well as foraminal setnosis. This was discussed with the patient as well as risks, benefits, and alternatives and the patient wished to proceed with surgical treatment.   OPERATIVE DETAIL: The patient was taken to the operating room and placed on the OR table in the supine position. A formal time out was performed with two patient identifiers and confirmed the operative site. Anesthesia was induced by the anesthesia team.  Fluoroscopy was used to localize the surgical level and an incision was marked in a skin crease. The area was then prepped and draped in a sterile fashion. A transverse linear incision was made on the right side of the neck. The platysma was divided and the sternocleidomastoid muscle was identified. The carotid sheath was palpated, identified, and retracted laterally with the sternocleidomastoid muscle. The strap muscles were identified and retracted medially and the pretracheal fascia was entered. A bent spinal needle was used with fluoroscopy to localize the surgical level after dissection. The longus colli were elevated bilaterally and a self-retaining retractor was placed. The endotracheal tube cuff balloon was deflated and reinflated after retractor placement.   Anterior osteophytes were removed until flush with the anterior vertebral body. The disc annulus  was incised and a complete C4-C5 discectomy was performed. The posterior longitudinal ligament was incised followed by ligamentous and bony removal until no central canal stenosis was present. Decompression was then taken out laterally into the bilateral foramina until no foraminal stenosis was palpable. A 59mm cortical allograft (Medtronic) was inserted into the disc space as an interbody graft.   This procedure was then repeated in the same fashion at the C5-6 and C6-7 levels with decompression of the central canal as well as bilateral foramina. A 4mm cortical allograft (Medtronic) was inserted into the C5-6 disc space as an interbody graft and a 43mm cortical allograft (Medtronic) was inserted into the C6-7 disc space as an interbody graft.   An anterior plate (Medtronic) was positioned and 8, 43mm screws were used to secure the plate to the C4, C5, C6 and C7 vertebral bodies. Hemostasis was obtained and the incision was closed in layers. All instrument and sponge counts were correct. The patient was then returned to anesthesia for emergence. No apparent complications at the completion of the procedure.   EBL:    DRAINS: none   SPECIMENS: none   Jadene Pierini, MD 01/17/20 11:02 AM

## 2020-01-17 NOTE — Anesthesia Postprocedure Evaluation (Signed)
Anesthesia Post Note  Patient: Jacqueline Spencer  Procedure(s) Performed: Cervical four-five Cervical five-six Cervical six-seven Anterior cervical decompression/discectomy/fusion (N/A Spine Cervical)     Anesthesia Post Evaluation No complications documented.  Last Vitals:  Vitals:   01/17/20 1645 01/17/20 1705  BP: (!) 144/88 (!) 142/75  Pulse: 70 68  Resp: 15 18  Temp: 37.2 C 36.5 C  SpO2: 95% 95%    Last Pain:  Vitals:   01/17/20 1705  TempSrc: Oral  PainSc:                  Vonna Brabson

## 2020-01-18 DIAGNOSIS — M5412 Radiculopathy, cervical region: Secondary | ICD-10-CM | POA: Diagnosis not present

## 2020-01-18 MED ORDER — MELOXICAM 15 MG PO TABS: 15.0000 mg | ORAL_TABLET | Freq: Every day | ORAL | Status: DC

## 2020-01-18 MED ORDER — CYCLOBENZAPRINE HCL 10 MG PO TABS: 10.0000 mg | ORAL_TABLET | Freq: Three times a day (TID) | ORAL | 0 refills | Status: DC | PRN

## 2020-01-18 MED ORDER — OXYCODONE HCL 5 MG PO TABS: 5.0000 mg | ORAL_TABLET | ORAL | 0 refills | Status: DC | PRN

## 2020-01-18 NOTE — Progress Notes (Signed)
Neurosurgery Service Progress Note  Subjective: No acute events overnight, no dysphagia, arm pain improved, numbness almost resolved, ambulating well    Objective: Vitals:   01/17/20 1911 01/17/20 2334 01/18/20 0426 01/18/20 0730  BP: (!) 150/66 (!) 136/74 (!) 133/70 (!) 129/59  Pulse: 81 81 71 75  Resp: 20 20 20 18   Temp: 97.8 F (36.6 C) 98.8 F (37.1 C) 98.7 F (37.1 C) 98.3 F (36.8 C)  TempSrc: Oral Oral Oral Oral  SpO2: 96% 92% 95% 95%  Weight:      Height:       Temp (24hrs), Avg:98.5 F (36.9 C), Min:97.7 F (36.5 C), Max:99.4 F (37.4 C)  CBC Latest Ref Rng & Units 01/11/2020  WBC 4.0 - 10.5 K/uL 7.9  Hemoglobin 12.0 - 15.0 g/dL 01/13/2020  Hematocrit 36 - 46 % 41.8  Platelets 150 - 400 K/uL 177   BMP Latest Ref Rng & Units 01/11/2020  Glucose 70 - 99 mg/dL 97  BUN 8 - 23 mg/dL 22  Creatinine 01/13/2020 - 7.89 mg/dL 3.81  Sodium 0.17 - 510 mmol/L 139  Potassium 3.5 - 5.1 mmol/L 4.2  Chloride 98 - 111 mmol/L 107  CO2 22 - 32 mmol/L 22  Calcium 8.9 - 10.3 mg/dL 258    Intake/Output Summary (Last 24 hours) at 01/18/2020 0744 Last data filed at 01/17/2020 1654 Gross per 24 hour  Intake 1450 ml  Output 595 ml  Net 855 ml    Current Facility-Administered Medications:  .  0.9 %  sodium chloride infusion, 250 mL, Intravenous, Continuous, Banesa Tristan A, MD, Last Rate: 1 mL/hr at 01/17/20 1724, 250 mL at 01/17/20 1724 .  acetaminophen (TYLENOL) tablet 650 mg, 650 mg, Oral, Q4H PRN **OR** acetaminophen (TYLENOL) suppository 650 mg, 650 mg, Rectal, Q4H PRN, 01/19/20, MD .  cyclobenzaprine (FLEXERIL) tablet 10 mg, 10 mg, Oral, TID PRN, Jadene Pierini, MD, 10 mg at 01/17/20 1638 .  docusate sodium (COLACE) capsule 100 mg, 100 mg, Oral, BID, 01/19/20, MD, 100 mg at 01/17/20 2031 .  escitalopram (LEXAPRO) tablet 20 mg, 20 mg, Oral, Daily, Tripton Ned, 2032, MD, 20 mg at 01/17/20 1848 .  gabapentin (NEURONTIN) capsule 300 mg, 300 mg, Oral, BID,  Jodye Scali, 01/19/20, MD, 300 mg at 01/17/20 2031 .  hydrochlorothiazide (HYDRODIURIL) tablet 25 mg, 25 mg, Oral, Daily, 2032, MD, 25 mg at 01/17/20 1849 .  HYDROmorphone (DILAUDID) injection 1 mg, 1 mg, Intravenous, Q3H PRN, 01/19/20, MD .  hydrOXYzine (VISTARIL) injection 50 mg, 50 mg, Intramuscular, Q6H PRN, Gerre Ranum, Jadene Pierini, MD .  menthol-cetylpyridinium (CEPACOL) lozenge 3 mg, 1 lozenge, Oral, PRN **OR** phenol (CHLORASEPTIC) mouth spray 1 spray, 1 spray, Mouth/Throat, PRN, Orlie Cundari A, MD .  ondansetron (ZOFRAN) tablet 4 mg, 4 mg, Oral, Q6H PRN **OR** ondansetron (ZOFRAN) injection 4 mg, 4 mg, Intravenous, Q6H PRN, Clovis Pu, MD, 4 mg at 01/17/20 1852 .  oxybutynin (DITROPAN) tablet 10 mg, 10 mg, Oral, BID, 01/19/20, MD, 10 mg at 01/17/20 2031 .  oxyCODONE (Oxy IR/ROXICODONE) immediate release tablet 10 mg, 10 mg, Oral, Q4H PRN, 2032, MD, 10 mg at 01/18/20 0741 .  oxyCODONE (Oxy IR/ROXICODONE) immediate release tablet 5 mg, 5 mg, Oral, Q4H PRN, Cledis Sohn A, MD .  pantoprazole (PROTONIX) EC tablet 40 mg, 40 mg, Oral, Daily, Bosten Newstrom, 01/20/20, MD, 40 mg at 01/17/20 1848 .  polyethylene glycol (MIRALAX / GLYCOLAX) packet 17 g, 17 g,  Oral, Daily PRN, Jadene Pierini, MD .  rosuvastatin (CRESTOR) tablet 40 mg, 40 mg, Oral, QHS, Inioluwa Boulay, Clovis Pu, MD, 40 mg at 01/17/20 2030 .  sodium chloride flush (NS) 0.9 % injection 3 mL, 3 mL, Intravenous, Q12H, Madisson Kulaga A, MD .  sodium chloride flush (NS) 0.9 % injection 3 mL, 3 mL, Intravenous, PRN, Jadene Pierini, MD   Physical Exam: AOx3, PERRL, EOMI, FS, Strength 5/5 x4, improved sensation in BUE, mild L hoffman's, incision c/d/i  Assessment & Plan: 69 y.o. woman s/p 3 level ACDF, recovering well.  -discharge home today  Jadene Pierini  01/18/20 7:44 AM

## 2020-01-18 NOTE — Progress Notes (Signed)
Occupational Therapy Evaluation Completed all education regarding ADL and functional mobility for ADL while adhering to cervical precautions. Pt able to return demonstrate during ADL session. Handout reviewed. Pt states BUE sensation has improved and only has "a little" numbness". No further OT needs.     01/18/20 0913  OT Visit Information  Last OT Received On 01/18/20  Assistance Needed +1  History of Present Illness 69 y.o. woman s/p 3 level ACDF (C5,6,7) due to cervical radiculopathy and myelopathy. PMH: depression, HTN; bladder sling; sleep apnea.  Precautions  Precautions Cervical  Precaution Booklet Issued Yes (comment)  Precaution Comments Provided handout, OT to discuss in more detail  Required Braces or Orthoses Cervical Brace  Cervical Brace Soft collar  Restrictions  Weight Bearing Restrictions No  Home Living  Family/patient expects to be discharged to: Private residence  Living Arrangements Spouse/significant other;Children  Available Help at Discharge Family;Friend(s);Available 24 hours/day  Type of Home House  Home Access Stairs to enter  Entrance Stairs-Number of Steps 2-3 and 3-4 depending on bfs house vs her house  Entrance Stairs-Rails Right  Home Layout One level  Bathroom Shower/Tub Walk-in shower  Bathroom Toilet Handicapped height  Home Equipment Shower seat - built in;BSC  Prior Function  Level of Independence Independent  Comments Retired. Plans to stay with b/f until Wednesday and then daughter will stay with her after. Independent with ADLs/IADLs, limited ambulator at baseline due to dyspnea.  Communication  Communication No difficulties  Pain Assessment  Pain Assessment Faces  Faces Pain Scale 4  Pain Location neck  Pain Descriptors / Indicators Aching  Pain Intervention(s) Limited activity within patient's tolerance  Cognition  Arousal/Alertness Awake/alert  Behavior During Therapy WFL for tasks assessed/performed  Overall Cognitive Status  Within Functional Limits for tasks assessed  Upper Extremity Assessment  Upper Extremity Assessment Overall WFL for tasks assessed (strength is functional; mild numbness but improved )  Lower Extremity Assessment  Lower Extremity Assessment Defer to PT evaluation  Cervical / Trunk Assessment  Cervical / Trunk Assessment Other exceptions (cervical surgery)  ADL  Overall ADL's  Needs assistance/impaired  Functional mobility during ADLs Modified independent  General ADL Comments Educated on compensatory strategies and use of AE adn DME for ADL. REcommend toilet tongs as pt leans over a significant amount to complete pericare. Toilet tongs help considerably. Able to complete figure four position to avoid bending with LB dressing. Rec9ommend reacher to retrieve items from floor. Educated on cervical precautions for IADL tasks  Bed Mobility  Overal bed mobility Needs Assistance  General bed mobility comments VC for correct technique. Pt able to return demonstrate after education.   Transfers  Overall transfer level Modified independent  Balance  Overall balance assessment Mild deficits observed, not formally tested  OT - End of Session  Equipment Utilized During Treatment Cervical collar  Activity Tolerance Patient tolerated treatment well  Patient left in bed;with call bell/phone within reach (sitting EOB)  Nurse Communication Other (comment) (pt ready for DC)  OT Assessment  OT Recommendation/Assessment Patient does not need any further OT services  OT Visit Diagnosis Unsteadiness on feet (R26.81);Muscle weakness (generalized) (M62.81);Pain  Pain - part of body  (neck)  OT Problem List Decreased knowledge of use of DME or AE;Decreased knowledge of precautions;Obesity;Pain  AM-PAC OT "6 Clicks" Daily Activity Outcome Measure (Version 2)  Help from another person eating meals? 4  Help from another person taking care of personal grooming? 3  Help from another person toileting, which  includes using  toliet, bedpan, or urinal? 3  Help from another person bathing (including washing, rinsing, drying)? 3  Help from another person to put on and taking off regular upper body clothing? 4  Help from another person to put on and taking off regular lower body clothing? 3  6 Click Score 20  OT Recommendation  Follow Up Recommendations No OT follow up;Supervision - Intermittent  OT Equipment None recommended by OT  Acute Rehab OT Goals  Patient Stated Goal to take care of herself  OT Goal Formulation All assessment and education complete, DC therapy  OT Time Calculation  OT Start Time (ACUTE ONLY) 0855  OT Stop Time (ACUTE ONLY) 0917  OT Time Calculation (min) 22 min  OT General Charges  $OT Visit 1 Visit  OT Evaluation  $OT Eval Low Complexity 1 Low  Written Expression  Dominant Hand Right  Luisa Dago, OT/L   Acute OT Clinical Specialist Acute Rehabilitation Services Pager [phone] Office [phone]

## 2020-01-18 NOTE — Evaluation (Signed)
Physical Therapy Evaluation Patient Details Name: Jacqueline Spencer MRN: [id number] DOB: 27-Jan-1951 Today's Date: 01/18/2020   History of Present Illness  69 y.o. woman s/p 3 level ACDF (C5,6,7) due to cervical radiculopathy and myelopathy. PMH: depression, HTN; bladder sling; sleep apnea.  Clinical Impression  Patient presents with pain and post surgical deficits s/p above surgery. Pt independent with ADls and IADLs PTA. Plans to stay with b/f for a few days and then daughter will stay with her over the weekend. Today, pt tolerated bed mobility, transfers and gait training with supervision-mod I for safety. Tolerated stair training with Mod I and use of rail for support. Education re: cervical collar, log roll technique, mobility recommendations and precautions. Pt does not require skilled therapy services as pt functioning at supervision-Mod I level and will have support at home. All education completed. Discharge from therapy.    Follow Up Recommendations No PT follow up    Equipment Recommendations  None recommended by PT    Recommendations for Other Services       Precautions / Restrictions Precautions Precautions: Cervical Precaution Booklet Issued: Yes (comment) Precaution Comments: Provided handout, OT to discuss in more detail Required Braces or Orthoses: Cervical Brace Cervical Brace: Soft collar Restrictions Weight Bearing Restrictions: No      Mobility  Bed Mobility Overal bed mobility: Modified Independent             General bed mobility comments: HOB flat and no rails to simulate home; cues for log roll technique, tendency to want to roll half way on side and try to sit up.  Transfers Overall transfer level: Modified independent Equipment used: None             General transfer comment: Stood from [employer], from toilet x1, no difficulties.  Ambulation/Gait Ambulation/Gait assistance: Supervision Gait Distance (Feet): 350 Feet Assistive device:  None Gait Pattern/deviations: Step-through pattern;Decreased step length - right;Decreased step length - left;Wide base of support;Antalgic Gait velocity: decreased   General Gait Details: Slow, waddling type gait pattern, feels better walking close to rail to use for support as needed. 2/4 DOE.  Stairs Stairs: Yes Stairs assistance: Modified independent (Device/Increase time) Stair Management: One rail Left;Step to pattern Number of Stairs: 4 General stair comments: Cues for safety/technique.  Wheelchair Mobility    Modified Rankin (Stroke Patients Only)       Balance Overall balance assessment: Needs assistance Sitting-balance support: Feet supported;No upper extremity supported Sitting balance-Leahy Scale: Good     Standing balance support: During functional activity Standing balance-Leahy Scale: Good Standing balance comment: Able to wash hands at sink reaching outside BoS without diffculty.                             Pertinent Vitals/Pain Pain Assessment: Faces Faces Pain Scale: Hurts little more Pain Location: right shoulder, upper arm Pain Descriptors / Indicators: Aching;Operative site guarding;Sore Pain Intervention(s): Premedicated before session;Monitored during session;Repositioned    Home Living Family/patient expects to be discharged to:: Private residence Living Arrangements: Spouse/significant other;Children Available Help at Discharge: Family;Friend(s);Available 24 hours/day Type of Home: House Home Access: Stairs to enter Entrance Stairs-Rails: Right Entrance Stairs-Number of Steps: 2-3 and 3-4 depending on bfs house vs her house Home Layout: One level Home Equipment: Shower seat - built in;Bedside commode      Prior Function Level of Independence: Independent         Comments: Retired. Plans to stay with b/f until  Wednesday and then daughter will stay with her after. Independent with ADLs/IADLs, limited ambulator at baseline due  to dyspnea.     Hand Dominance   Dominant Hand: Right    Extremity/Trunk Assessment   Upper Extremity Assessment Upper Extremity Assessment: Defer to OT evaluation (Reports no numbness/tingling, decent grip strength bilaterally)    Lower Extremity Assessment Lower Extremity Assessment: Overall WFL for tasks assessed    Cervical / Trunk Assessment Cervical / Trunk Assessment: Other exceptions Cervical / Trunk Exceptions: s/p spine surgery  Communication   Communication: No difficulties  Cognition Arousal/Alertness: Awake/alert Behavior During Therapy: WFL for tasks assessed/performed Overall Cognitive Status: Within Functional Limits for tasks assessed                                        General Comments      Exercises     Assessment/Plan    PT Assessment Patent does not need any further PT services  PT Problem List         PT Treatment Interventions      PT Goals (Current goals can be found in the Care Plan section)  Acute Rehab PT Goals Patient Stated Goal: to go home PT Goal Formulation: All assessment and education complete, DC therapy    Frequency     Barriers to discharge        Co-evaluation               AM-PAC PT "6 Clicks" Mobility  Outcome Measure Help needed turning from your back to your side while in a flat bed without using bedrails?: None Help needed moving from lying on your back to sitting on the side of a flat bed without using bedrails?: None Help needed moving to and from a bed to a chair (including a wheelchair)?: None Help needed standing up from a chair using your arms (e.g., wheelchair or bedside chair)?: None Help needed to walk in hospital room?: A Little Help needed climbing 3-5 steps with a railing? : A Little 6 Click Score: 22    End of Session Equipment Utilized During Treatment: Cervical collar Activity Tolerance: Patient tolerated treatment well Patient left: in bed;with call bell/phone  within reach (sitting EOB with OT present) Nurse Communication: Mobility status PT Visit Diagnosis: Pain Pain - Right/Left: Right Pain - part of body: Shoulder    Time: 1962-2297 PT Time Calculation (min) (ACUTE ONLY): 18 min   Charges:   PT Evaluation $PT Eval Moderate Complexity: 1 Mod          Vale Haven, PT, DPT Acute Rehabilitation Services Pager [phone] Office [phone]      Jacqueline Spencer 01/18/2020, 9:20 AM

## 2020-01-18 NOTE — Plan of Care (Signed)
Patient alert and oriented, mae's well, voiding adequate amount of urine, swallowing without difficulty, no c/o pain at time of discharge. Patient discharged home with family. Script and discharged instructions given to patient. Patient and family stated understanding of instructions given. Patient has an appointment with Dr. Ostergard   

## 2020-01-18 NOTE — Discharge Summary (Signed)
Discharge Summary  Date of Admission: 01/17/2020  Date of Discharge: 01/18/20  Attending Physician: Autumn Patty, MD  Hospital Course: Patient was admitted following an uncomplicated 3 level ACDF. She was recovered in PACU and transferred to [facility]. Her pain and numbness improved significantly immediately post op, her hospital course was uncomplicated and the patient was discharged home on 01/18/20. She will follow up in clinic with me in 2 weeks.  Neurologic exam at discharge:  AOx3, PERRL, EOMI, FS, TM Strength 5/5 x4, mild diffuse BUE numbness, mild R hoffman's  Discharge diagnosis: Cervical myelopathy, cervical radiculopathy  Jadene Pierini, MD 01/18/20 7:50 AM

## 2020-01-18 NOTE — Discharge Instructions (Addendum)
Discharge Instructions  Slowly increase your activity back to normal.   Your incision is closed with dermabond (purple glue). This will naturally fall off over the next 1-2 weeks.   Okay to shower on the day of discharge. Use regular soap and water and try to be gentle when cleaning your incision.   Call Your Doctor If Any of These Occur Redness, drainage, or swelling at the wound.   Temperature greater than 101 degrees.  Severe pain not relieved by pain medication.  Increased difficulty swallowing.   Incision starts to come apart.   Follow up with Dr. Maurice Small in 2 weeks after discharge. If you do not already have a discharge appointment, please call his office at [phone] to schedule a follow up appointment. If you have any concerns or questions, please call the office and let us know.

## 2020-01-19 ENCOUNTER — Encounter (HOSPITAL_COMMUNITY): Payer: Self-pay | Admitting: Neurological Surgery

## 2021-03-30 IMAGING — RF DG CERVICAL SPINE COMPLETE 4+V
1 series · 5 of 5 positions shown · non-contrast
Comparison: Cervical spine MRI 11/23/2019

CLINICAL DATA: Elective surgery. Additional history provided: C4-7
ACDF. Fluoroscopy time 33 seconds. 8.81 mGy.

EXAM:
CERVICAL SPINE - COMPLETE 4+ VIEW; DG C-ARM 1-60 MIN

[Series 1: run · 5 of 5 slices shown]
[im 1/5]
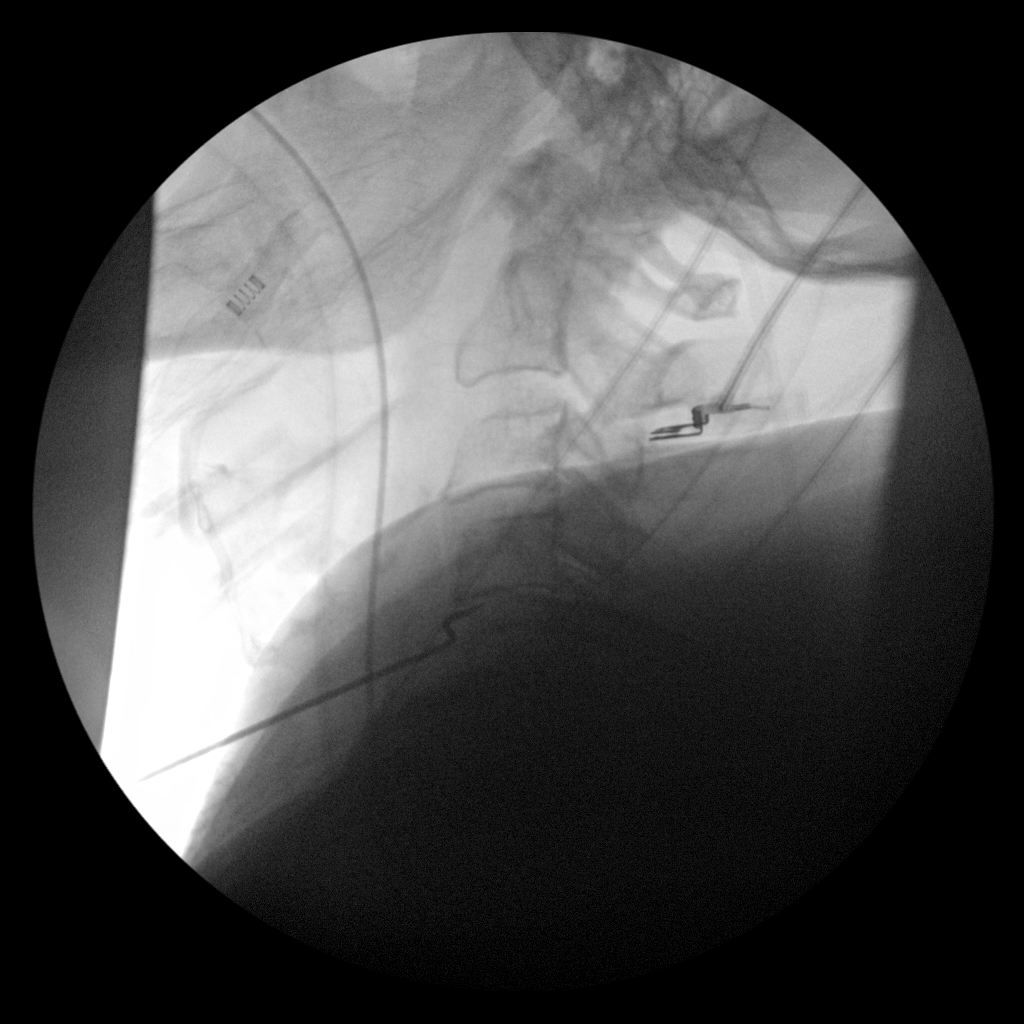
[im 2/5]
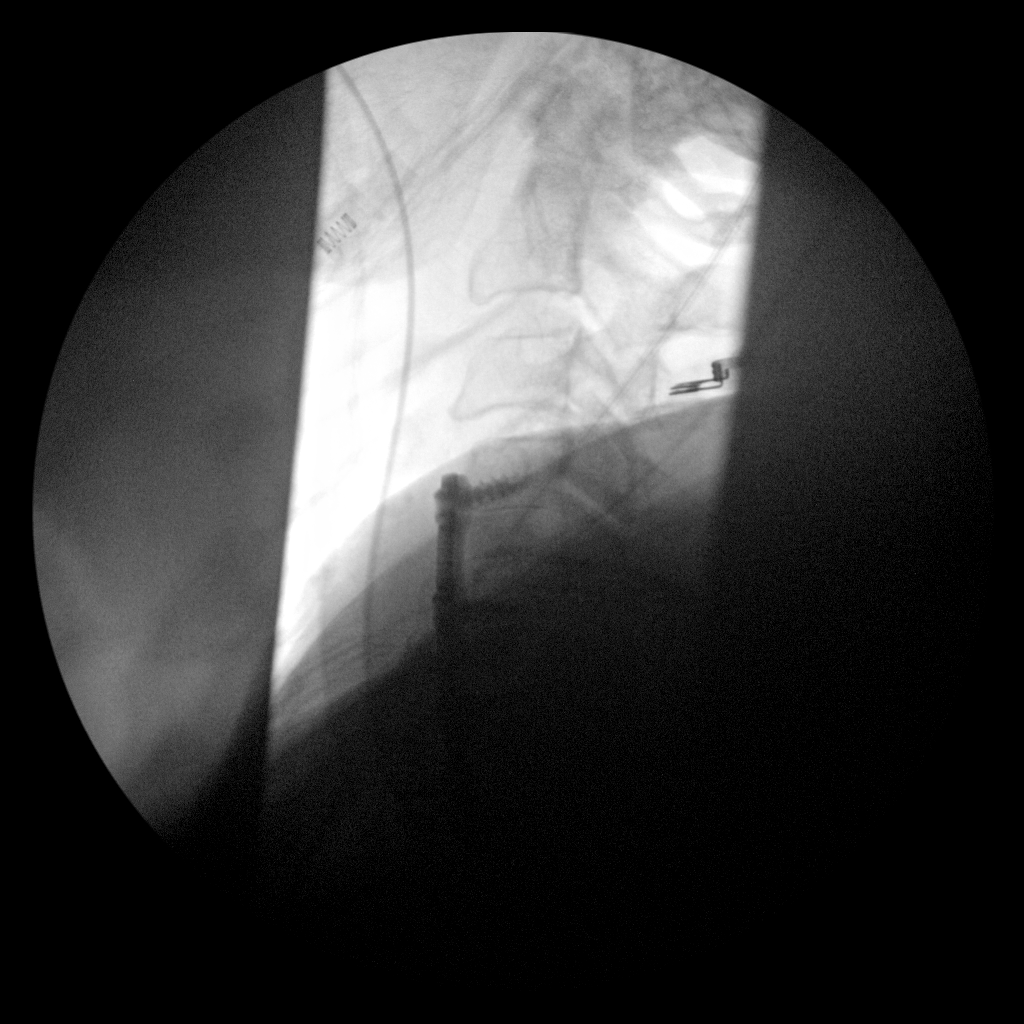
[im 3/5]
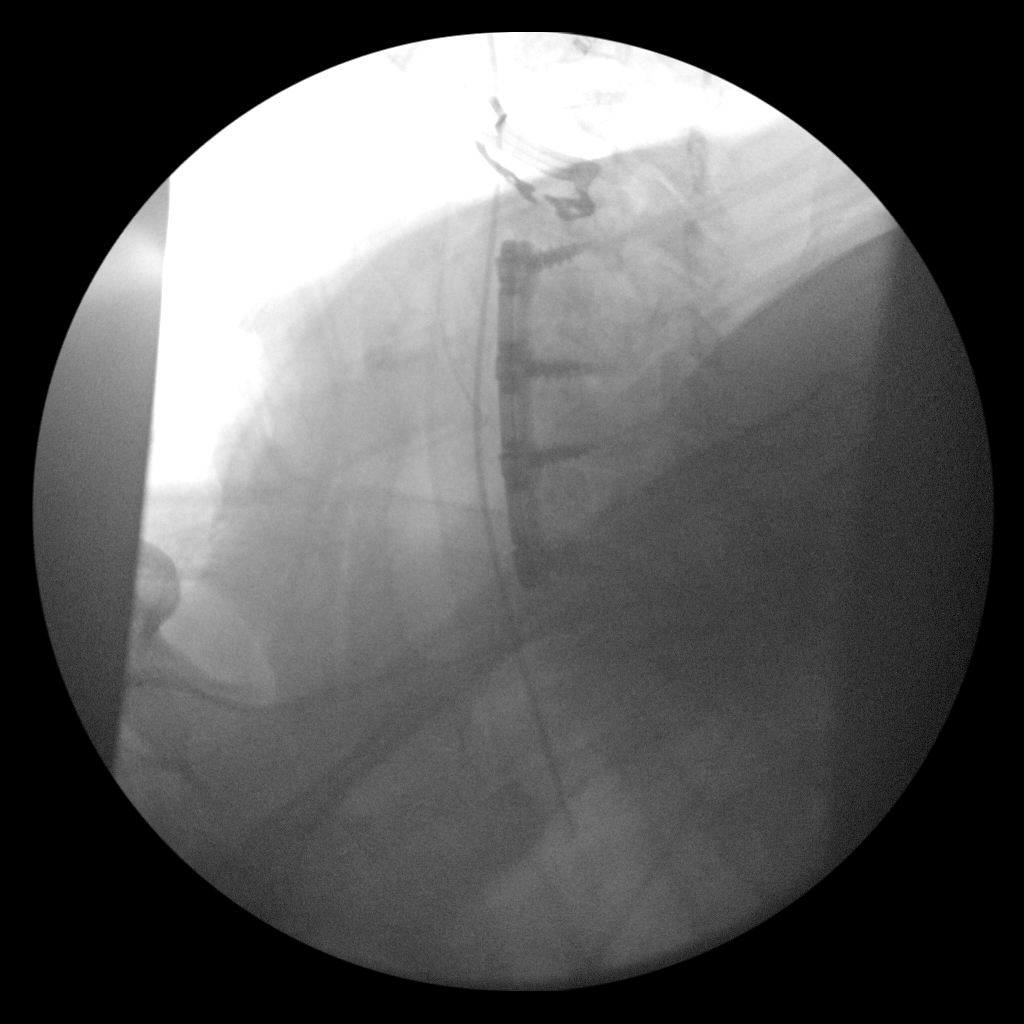
[im 4/5]
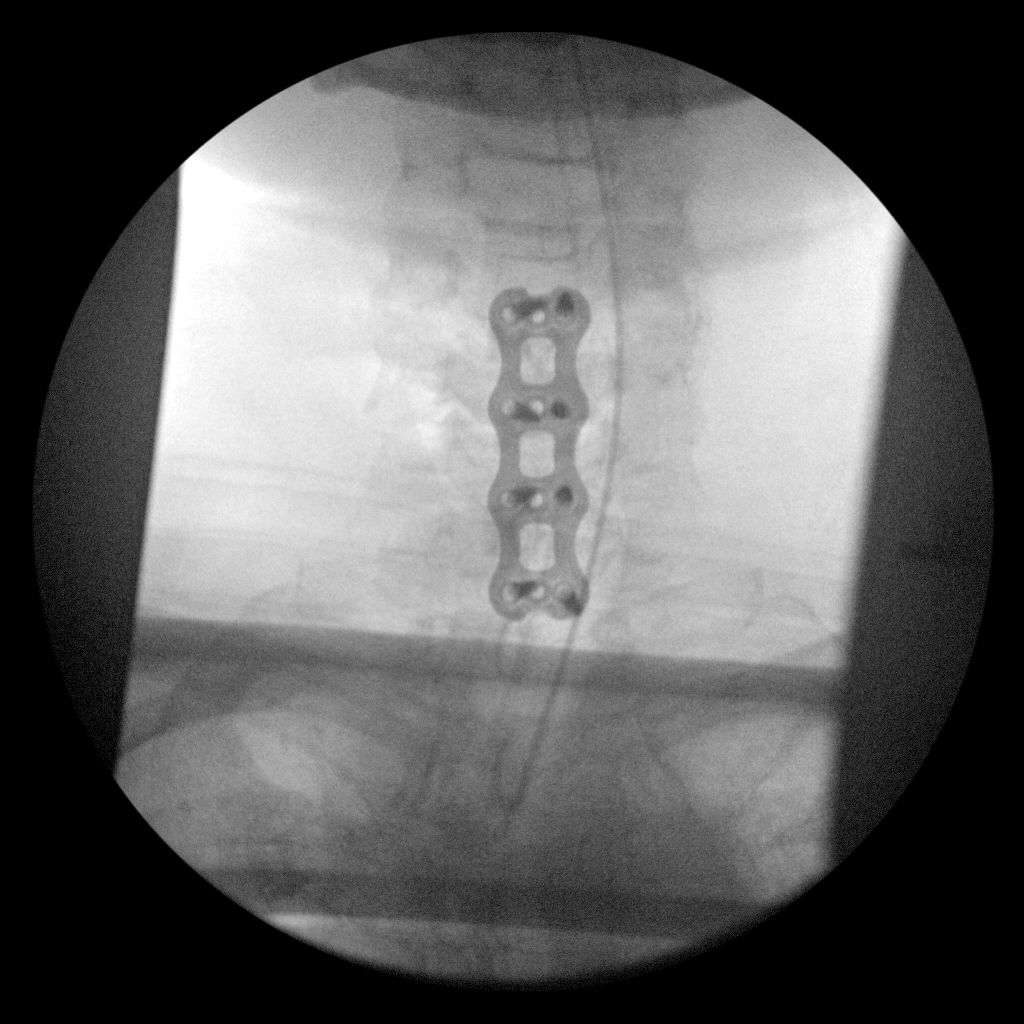
[im 5/5]
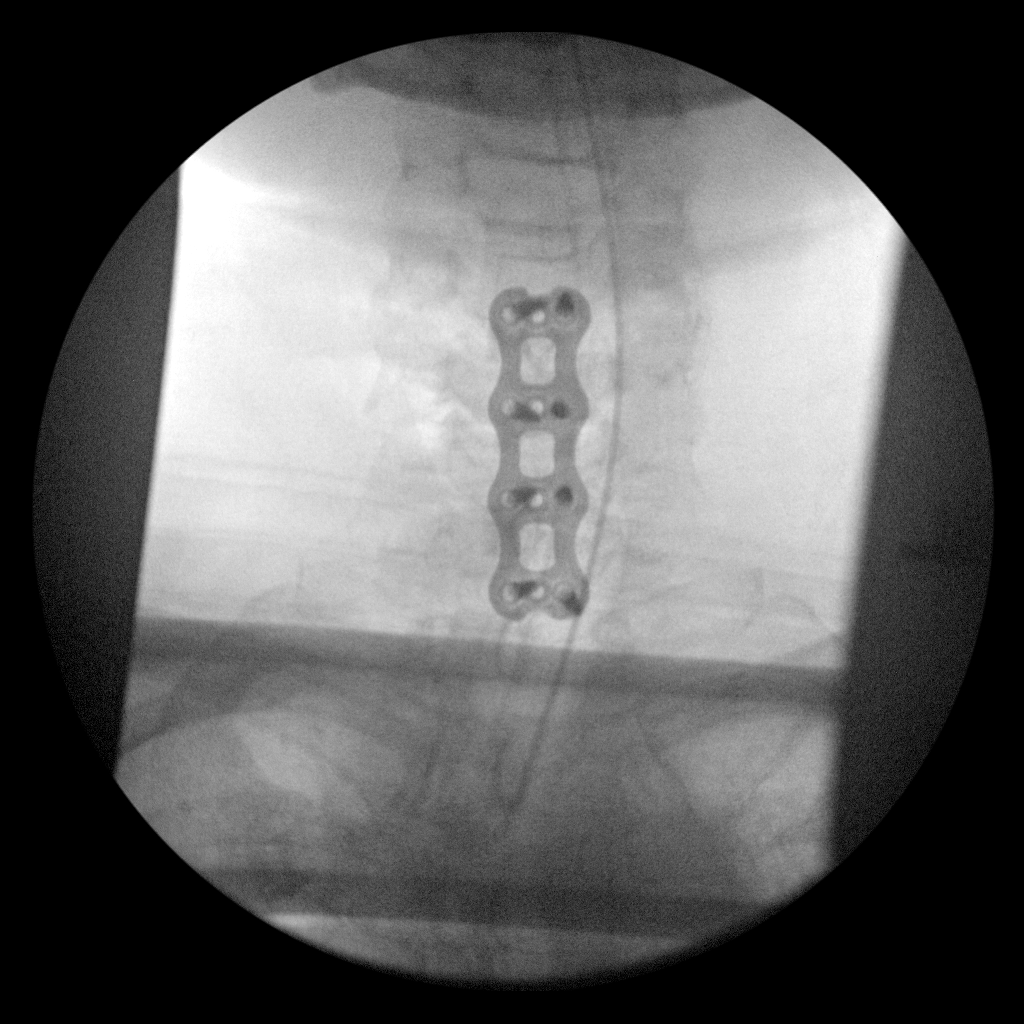

[5 of 5 positions shown; findings below may reference images not displayed]

FINDINGS: Four intraoperative fluoroscopic images of the cervical spine are
submitted, 3 lateral and 1 AP. On the initial lateral view image, a
metallic probe projects at the C4-C5 level. On subsequent images
there are postoperative changes from interval C4-C7 ACDF. No
unexpected finding. Partially visualized support tubes.
IMPRESSION: 4 intraoperative fluoroscopic images of the cervical spine from
reported C4-C7 ACDF. No unexpected finding.

## 2021-03-30 IMAGING — RF DG C-ARM 1-60 MIN
1 series · 5 of 5 positions shown · non-contrast
Comparison: Cervical spine MRI 11/23/2019

CLINICAL DATA: Elective surgery. Additional history provided: C4-7
ACDF. Fluoroscopy time 33 seconds. 8.81 mGy.

EXAM:
CERVICAL SPINE - COMPLETE 4+ VIEW; DG C-ARM 1-60 MIN

[Series 1: run · 5 of 5 slices shown]
[im 1/5]
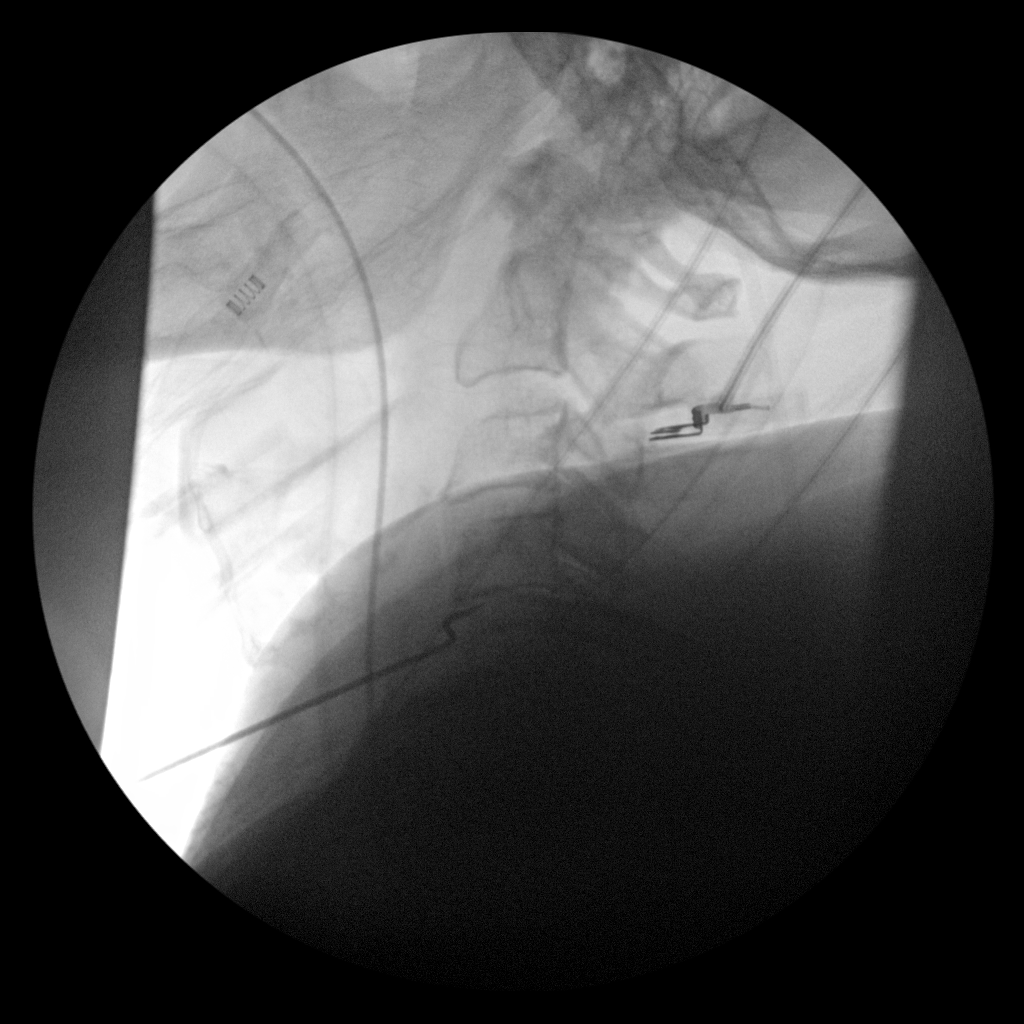
[im 2/5]
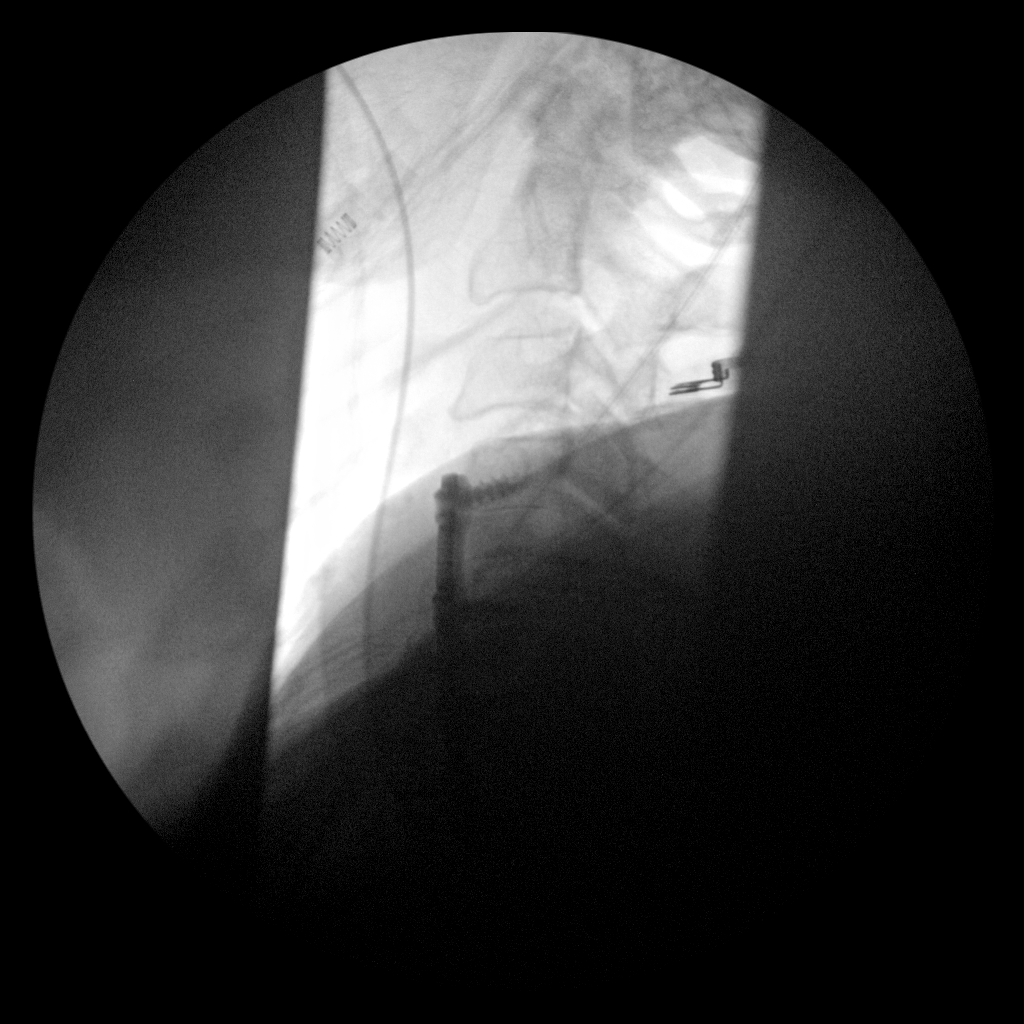
[im 3/5]
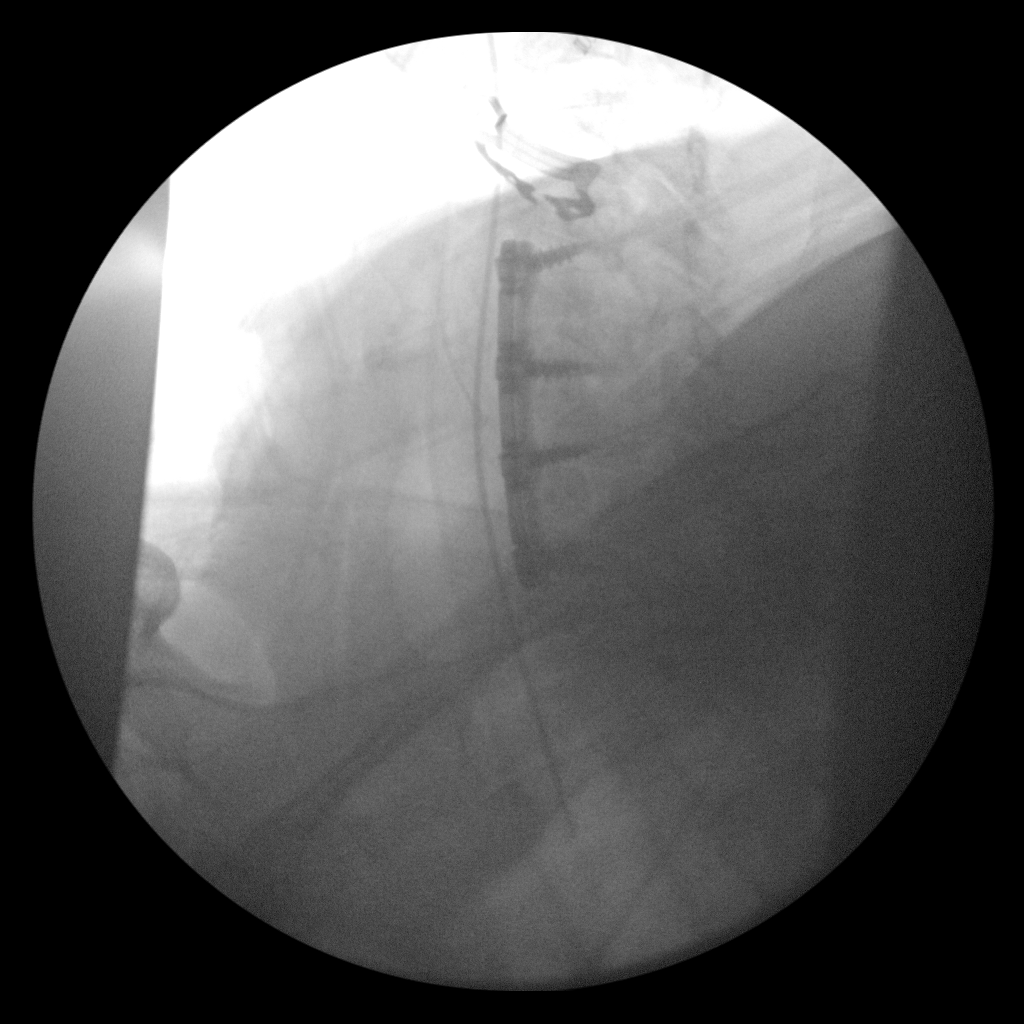
[im 4/5]
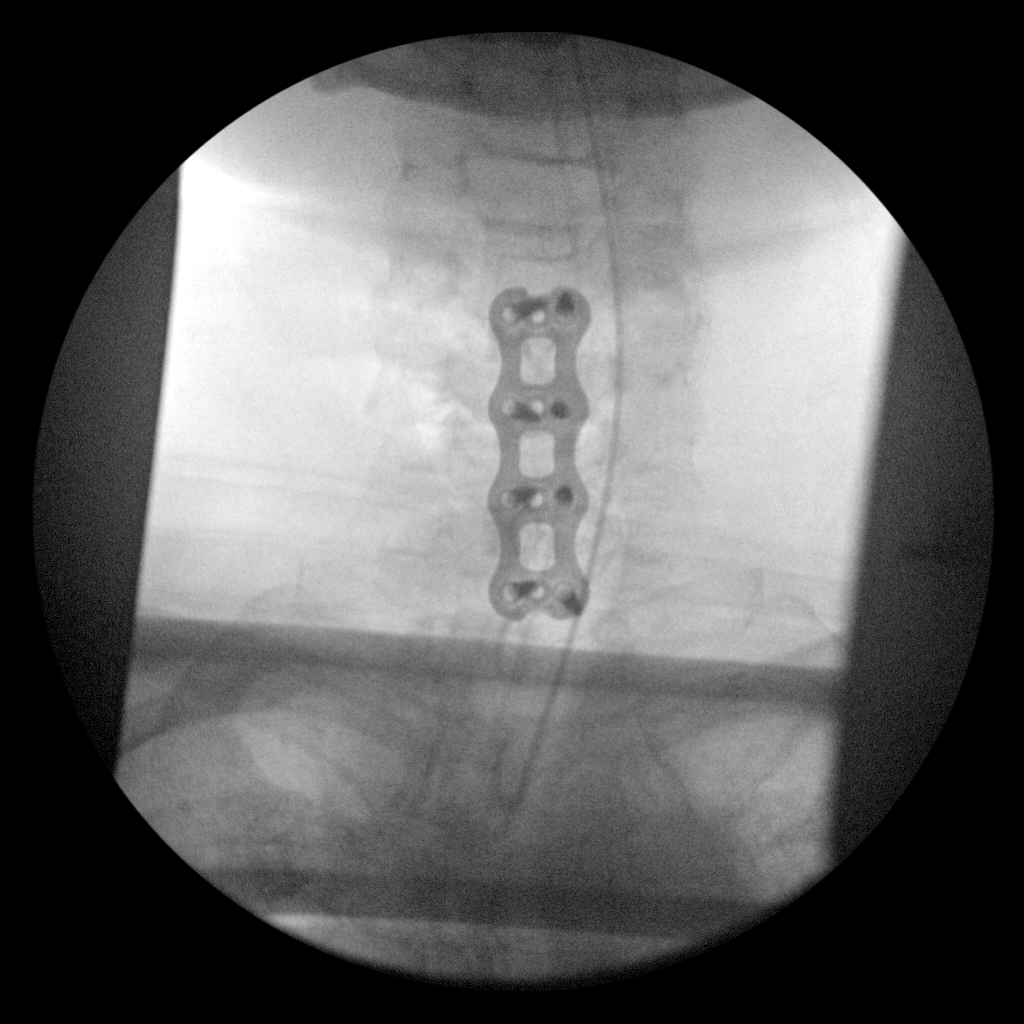
[im 5/5]
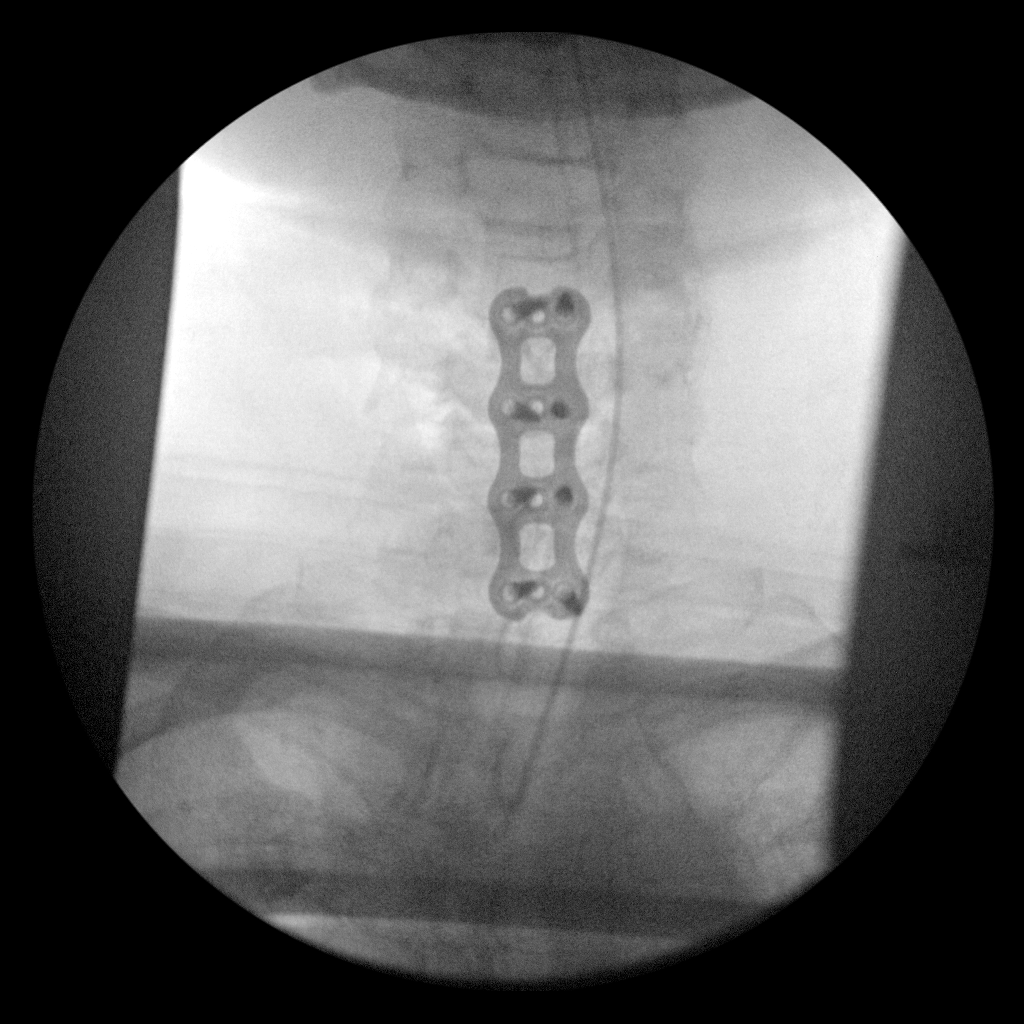

[5 of 5 positions shown; findings below may reference images not displayed]

FINDINGS: Four intraoperative fluoroscopic images of the cervical spine are
submitted, 3 lateral and 1 AP. On the initial lateral view image, a
metallic probe projects at the C4-C5 level. On subsequent images
there are postoperative changes from interval C4-C7 ACDF. No
unexpected finding. Partially visualized support tubes.
IMPRESSION: 4 intraoperative fluoroscopic images of the cervical spine from
reported C4-C7 ACDF. No unexpected finding.

## 2023-03-25 ENCOUNTER — Other Ambulatory Visit: Payer: Self-pay | Admitting: Family

## 2023-03-25 DIAGNOSIS — Z1231 Encounter for screening mammogram for malignant neoplasm of breast: Secondary | ICD-10-CM

## 2023-03-28 ENCOUNTER — Inpatient Hospital Stay: Admission: RE | Admit: 2023-03-28 | Payer: Medicare PPO | Source: Ambulatory Visit

## 2023-04-07 ENCOUNTER — Ambulatory Visit
Admission: RE | Admit: 2023-04-07 | Discharge: 2023-04-07 | Disposition: A | Discharge status: Home / Self Care | Payer: Medicare PPO | Source: Ambulatory Visit | Attending: Family | Admitting: Family

## 2023-04-07 DIAGNOSIS — Z1231 Encounter for screening mammogram for malignant neoplasm of breast: Secondary | ICD-10-CM

## 2023-10-29 ENCOUNTER — Ambulatory Visit (HOSPITAL_COMMUNITY): Payer: Self-pay | Admitting: Physician Assistant

## 2024-01-05 ENCOUNTER — Ambulatory Visit (HOSPITAL_COMMUNITY): Admit: 2024-01-05 | Admitting: Orthopedic Surgery

## 2024-01-05 SURGERY — DECOMPRESSIVE LUMBAR LAMINECTOMY LEVEL 3
Anesthesia: General

## 2024-02-25 ENCOUNTER — Ambulatory Visit (HOSPITAL_COMMUNITY): Payer: Self-pay | Admitting: Physician Assistant

## 2024-03-02 NOTE — Progress Notes (Signed)
 Surgical Instructions   Your procedure is scheduled on Monday March 08, 2024. Report to [facility] Main Entrance A at 10:30 A.M., then check in with the Admitting office. Any questions or running late day of surgery: call [phone]  Questions prior to your surgery date: call [phone], Monday-Friday, 8am-4pm. If you experience any cold or flu symptoms such as cough, fever, chills, shortness of breath, etc. between now and your scheduled surgery, please notify us  at the above number.     Remember:  Do not eat after midnight the night before your surgery  You may drink clear liquids until 9:30 the morning of your surgery.   Clear liquids allowed are: Water, Non-Citrus Juices (without pulp), Carbonated Beverages, Clear Tea (no milk, honey, etc.), Black Coffee Only (NO MILK, CREAM OR POWDERED CREAMER of any kind), and Gatorade.    Take these medicines the morning of surgery with A SIP OF WATER  DULoxetine (CYMBALTA)  gabapentin  (NEURONTIN )  methocarbamol (ROBAXIN)  omeprazole (PRILOSEC)  solifenacin (VESICARE)   May take these medicines IF NEEDED: acetaminophen  (TYLENOL )    One week prior to surgery, STOP taking any Aspirin (unless otherwise instructed by your surgeon) Aleve, Naproxen, Ibuprofen, Motrin, Advil, Goody's, BC's, all herbal medications, fish oil, and non-prescription vitamins. THIS INCLUDES YOUR meloxicam  (MOBIC )                      Do NOT Smoke (Tobacco/Vaping) for 24 hours prior to your procedure.  If you use a CPAP at night, you may bring your mask/headgear for your overnight stay.   You will be asked to remove any contacts, glasses, piercing's, hearing aid's, dentures/partials prior to surgery. Please bring cases for these items if needed.    Patients discharged the day of surgery will not be allowed to drive home, and someone needs to stay with them for 24 hours.  SURGICAL WAITING ROOM VISITATION Patients may have no more than 2 support people in  the waiting area - these visitors may rotate.   Pre-op nurse will coordinate an appropriate time for 1 ADULT support person, who may not rotate, to accompany patient in pre-op.  Children under the age of 79 must have an adult with them who is not the patient and must remain in the main waiting area with an adult.  If the patient needs to stay at the hospital during part of their recovery, the visitor guidelines for inpatient rooms apply.  Please refer to the [facility] website for the visitor guidelines for any additional information.   If you received a COVID test during your pre-op visit  it is requested that you wear a mask when out in public, stay away from anyone that may not be feeling well and notify your surgeon if you develop symptoms. If you have been in contact with anyone that has tested positive in the last 10 days please notify you surgeon.      Pre-operative 5 CHG Bathing Instructions   You can play a key role in reducing the risk of infection after surgery. Your skin needs to be as free of germs as possible. You can reduce the number of germs on your skin by washing with CHG (chlorhexidine  gluconate) soap before surgery. CHG is an antiseptic soap that kills germs and continues to kill germs even after washing.   DO NOT use if you have an allergy to chlorhexidine /CHG or antibacterial soaps. If your skin becomes reddened or irritated, stop using the CHG and notify  one of our RNs at [phone].   Please shower with the CHG soap starting 4 days before surgery using the following schedule:     Please keep in mind the following:  DO NOT shave, including legs and underarms, starting the day of your first shower.   You may shave your face at any point before/day of surgery.  Place clean sheets on your bed the day you start using CHG soap. Use a clean washcloth (not used since being washed) for each shower. DO NOT sleep with pets once you start using the CHG.   CHG Shower  Instructions:  Wash your face and private area with normal soap. If you choose to wash your hair, wash first with your normal shampoo.  After you use shampoo/soap, rinse your hair and body thoroughly to remove shampoo/soap residue.  Turn the water OFF and apply about 3 tablespoons (45 ml) of CHG soap to a CLEAN washcloth.  Apply CHG soap ONLY FROM YOUR NECK DOWN TO YOUR TOES (washing for 3-5 minutes)  DO NOT use CHG soap on face, private areas, open wounds, or sores.  Pay special attention to the area where your surgery is being performed.  If you are having back surgery, having someone wash your back for you may be helpful. Wait 2 minutes after CHG soap is applied, then you may rinse off the CHG soap.  Pat dry with a clean towel  Put on clean clothes/pajamas   If you choose to wear lotion, please use ONLY the CHG-compatible lotions that are listed below.  Additional instructions for the day of surgery: DO NOT APPLY any lotions, deodorants, cologne, or perfumes.   Do not bring valuables to the hospital. [facility] is not responsible for any belongings/valuables. Do not wear nail polish, gel polish, artificial nails, or any other type of covering on natural nails (fingers and toes) Do not wear jewelry or makeup Put on clean/comfortable clothes.  Please brush your teeth.  Ask your nurse before applying any prescription medications to the skin.     CHG Compatible Lotions   Aveeno Moisturizing lotion  Cetaphil Moisturizing Cream  Cetaphil Moisturizing Lotion  Clairol Herbal Essence Moisturizing Lotion, Dry Skin  Clairol Herbal Essence Moisturizing Lotion, Extra Dry Skin  Clairol Herbal Essence Moisturizing Lotion, Normal Skin  Curel Age Defying Therapeutic Moisturizing Lotion with Alpha Hydroxy  Curel Extreme Care Body Lotion  Curel Soothing Hands Moisturizing Hand Lotion  Curel Therapeutic Moisturizing Cream, Fragrance-Free  Curel Therapeutic Moisturizing Lotion, Fragrance-Free   Curel Therapeutic Moisturizing Lotion, Original Formula  Eucerin Daily Replenishing Lotion  Eucerin Dry Skin Therapy Plus Alpha Hydroxy Crme  Eucerin Dry Skin Therapy Plus Alpha Hydroxy Lotion  Eucerin Original Crme  Eucerin Original Lotion  Eucerin Plus Crme Eucerin Plus Lotion  Eucerin TriLipid Replenishing Lotion  Keri Anti-Bacterial Hand Lotion  Keri Deep Conditioning Original Lotion Dry Skin Formula Softly Scented  Keri Deep Conditioning Original Lotion, Fragrance Free Sensitive Skin Formula  Keri Lotion Fast Absorbing Fragrance Free Sensitive Skin Formula  Keri Lotion Fast Absorbing Softly Scented Dry Skin Formula  Keri Original Lotion  Keri Skin Renewal Lotion Keri Silky Smooth Lotion  Keri Silky Smooth Sensitive Skin Lotion  Nivea Body Creamy Conditioning Oil  Nivea Body Extra Enriched Teacher, adult education Moisturizing Lotion Nivea Crme  Nivea Skin Firming Lotion  NutraDerm 30 Skin Lotion  NutraDerm Skin Lotion  NutraDerm Therapeutic Skin Cream  NutraDerm Therapeutic Skin Lotion  ProShield Protective Hand  Cream  Provon moisturizing lotion  Please read over the following fact sheets that you were given.

## 2024-03-03 ENCOUNTER — Other Ambulatory Visit: Payer: Self-pay

## 2024-03-03 ENCOUNTER — Encounter (HOSPITAL_COMMUNITY): Payer: Self-pay

## 2024-03-03 ENCOUNTER — Encounter (HOSPITAL_COMMUNITY)
Admission: RE | Admit: 2024-03-03 | Discharge: 2024-03-03 | Disposition: A | Discharge status: Home / Self Care | Source: Ambulatory Visit | Attending: Orthopedic Surgery | Admitting: Orthopedic Surgery

## 2024-03-03 VITALS — BP 124/63 | HR 71 | Temp 98.4°F | Resp 16 | Ht 67.0 in | Wt 237.8 lb

## 2024-03-03 DIAGNOSIS — E7800 Pure hypercholesterolemia, unspecified: Secondary | ICD-10-CM | POA: Insufficient documentation

## 2024-03-03 DIAGNOSIS — Z6837 Body mass index (BMI) 37.0-37.9, adult: Secondary | ICD-10-CM | POA: Insufficient documentation

## 2024-03-03 DIAGNOSIS — M48062 Spinal stenosis, lumbar region with neurogenic claudication: Secondary | ICD-10-CM | POA: Insufficient documentation

## 2024-03-03 DIAGNOSIS — G4733 Obstructive sleep apnea (adult) (pediatric): Secondary | ICD-10-CM | POA: Insufficient documentation

## 2024-03-03 DIAGNOSIS — Z01812 Encounter for preprocedural laboratory examination: Secondary | ICD-10-CM | POA: Diagnosis present

## 2024-03-03 DIAGNOSIS — E669 Obesity, unspecified: Secondary | ICD-10-CM | POA: Diagnosis not present

## 2024-03-03 DIAGNOSIS — I10 Essential (primary) hypertension: Secondary | ICD-10-CM | POA: Diagnosis not present

## 2024-03-03 DIAGNOSIS — F32A Depression, unspecified: Secondary | ICD-10-CM | POA: Diagnosis not present

## 2024-03-03 DIAGNOSIS — Z01818 Encounter for other preprocedural examination: Secondary | ICD-10-CM

## 2024-03-03 LAB — TYPE AND SCREEN
ABO/RH(D): O POS
Antibody Screen: NEGATIVE

## 2024-03-03 LAB — CBC
HCT: 42.3 % (ref 36.0–46.0)
Hemoglobin: 14.5 g/dL (ref 12.0–15.0)
MCH: 30.2 pg (ref 26.0–34.0)
MCHC: 34.3 g/dL (ref 30.0–36.0)
MCV: 88.1 fL (ref 80.0–100.0)
Platelets: 168 K/uL (ref 150–400)
RBC: 4.8 MIL/uL (ref 3.87–5.11)
RDW: 12.4 % (ref 11.5–15.5)
WBC: 6.4 K/uL (ref 4.0–10.5)
nRBC: 0 % (ref 0.0–0.2)

## 2024-03-03 LAB — SURGICAL PCR SCREEN
MRSA, PCR: NEGATIVE
Staphylococcus aureus: POSITIVE — AB

## 2024-03-03 NOTE — Progress Notes (Signed)
 PCP - Angeline Iba  Cardiologist -   PPM/ICD - denies Device Orders - n/a Rep Notified - n/a  Chest x-ray - denies EKG - requested from PCP Stress Test - denies ECHO - denies Cardiac Cath - denies  Sleep Study - 20+ years ago CPAP - Unable to tolerated no further follow up per patient  DM- denies Last dose of GLP1 agonist-  Zebound last dose-02-23-24   Blood Thinner Instructions:denies Aspirin Instructions:n/a  ERAS Protcol -clear liquids until 9:30 am.   COVID TEST- n/a   Anesthesia review: yes HTN, OSA and clearance  Patient denies shortness of breath, fever, cough and chest pain at PAT appointment   All instructions explained to the patient, with a verbal understanding of the material. Patient agrees to go over the instructions while at home for a better understanding. Patient also instructed to self quarantine after being tested for COVID-19. The opportunity to ask questions was provided.

## 2024-03-04 NOTE — Progress Notes (Signed)
 Anesthesia Chart Review:   Case: [id number] Date/Time: 03/08/24 1215   Procedure: DECOMPRESSIVE LUMBAR LAMINECTOMY LEVEL 3 - Lumbar decompression with insitu fusion L2-5   Anesthesia type: General   Diagnosis: Spinal stenosis of lumbar region with neurogenic claudication [M48.062]   Pre-op diagnosis: lumbar spinal stenosis L2-5 with neurogenic claudication   [facility]: [facility] ROOM 04 / [facility]   Surgeons: Burnetta Aures, MD       DISCUSSION: Patient is a 73 year old female scheduled for the above procedure.  History includes former smoker (quit 06/25/19), HTN, OSA (intolerant to CPAP), hypercholesterolemia, depression, spinal surgery (C4-7 ACDF 01/17/2020), osteoarthritis (right THA), obesity.   She had preoperative medical evaluation by Silvano Angeline FALCON, NP on 02/09/2024 with EKG and spirometry. EKG showed NSR. Spirometry was normal by office note. She denied chest pain SOB. NP felt patient was medically stable for surgery.     Last Zebound 02/23/2024. She is on for weight management.   Her preoperative CBC was normal, but it does not appear that a BMP was done with PAT labs. She has a history of HTN and is on hydrochlorothiazide , so will need to get on arrival for surgery.    Anesthesia team to evaluate on the day of surgery.   VS: BP 124/63   Pulse 71   Temp 36.9 C   Resp 16   Ht 5' 7 (1.702 m)   Wt 107.9 kg   SpO2 96%   BMI 37.24 kg/m    PROVIDERS: Silvano Angeline FALCON, NP is PCP [facility])   LABS: See DISCUSSION. (all labs ordered are listed, but only abnormal results are displayed)  Labs Reviewed  SURGICAL PCR SCREEN - Abnormal; Notable for the following components:      Result Value   Staphylococcus aureus POSITIVE (*)    All other components within normal limits  CBC  TYPE AND SCREEN    EKG: 02/07/2024 (scanned under Media tab): NSR    CV: N/A  Past Medical History:  Diagnosis Date   Arthritis    Depression    High cholesterol    History of  bladder surgery    Bladder Sling   Hypertension    Sleep apnea     Past Surgical History:  Procedure Laterality Date   ANTERIOR CERVICAL DECOMP/DISCECTOMY FUSION N/A 01/17/2020   Procedure: Cervical four-five Cervical five-six Cervical six-seven Anterior cervical decompression/discectomy/fusion;  Surgeon: Cheryle Debby LABOR, MD;  Location: [facility];  Service: Neurosurgery;  Laterality: N/A;   APPENDECTOMY     BREAST SURGERY     Breast Reduction (bilateral)   CESAREAN SECTION     (4) C-sections   MENISCUS REPAIR Left    TOTAL HIP ARTHROPLASTY Right    TREATMENT FISTULA ANAL      MEDICATIONS:  acetaminophen  (TYLENOL ) 500 MG tablet   cyclobenzaprine  (FLEXERIL ) 10 MG tablet   DULoxetine (CYMBALTA) 60 MG capsule   gabapentin  (NEURONTIN ) 300 MG capsule   hydrochlorothiazide  (HYDRODIURIL ) 25 MG tablet   meloxicam  (MOBIC ) 15 MG tablet   methocarbamol (ROBAXIN) 500 MG tablet   omeprazole (PRILOSEC) 40 MG capsule   oxyCODONE  (OXY IR/ROXICODONE ) 5 MG immediate release tablet   Pseudoeph-Doxylamine-DM-APAP (NYQUIL PO)   rosuvastatin  (CRESTOR ) 40 MG tablet   solifenacin (VESICARE) 10 MG tablet   No current facility-administered medications for this encounter.    Isaiah Ruder, PA-C Surgical Short Stay/Anesthesiology [facility] Phone [phone] [facility] Phone [phone] 03/04/2024 1:31 PM

## 2024-03-04 NOTE — Anesthesia Preprocedure Evaluation (Signed)
 Anesthesia Evaluation    Airway        Dental   Pulmonary former smoker          Cardiovascular hypertension,      Neuro/Psych    GI/Hepatic   Endo/Other    Renal/GU      Musculoskeletal   Abdominal   Peds  Hematology   Anesthesia Other Findings   Reproductive/Obstetrics                              Anesthesia Physical Anesthesia Plan  ASA:   Anesthesia Plan:    Post-op Pain Management:    Induction:   PONV Risk Score and Plan:   Airway Management Planned:   Additional Equipment:   Intra-op Plan:   Post-operative Plan:   Informed Consent:   Plan Discussed with:   Anesthesia Plan Comments: (PAT note written 03/04/2024 by Solimar Maiden, PA-C.  )        Anesthesia Quick Evaluation

## 2024-03-08 ENCOUNTER — Encounter (HOSPITAL_COMMUNITY): Payer: Self-pay | Admitting: Certified Registered"

## 2024-03-08 ENCOUNTER — Encounter (HOSPITAL_COMMUNITY): Admission: RE | Payer: Self-pay | Source: Home / Self Care

## 2024-03-08 ENCOUNTER — Encounter (HOSPITAL_COMMUNITY): Payer: Self-pay | Admitting: Vascular Surgery

## 2024-03-08 ENCOUNTER — Inpatient Hospital Stay (HOSPITAL_COMMUNITY): Admission: RE | Admit: 2024-03-08 | Source: Home / Self Care | Admitting: Orthopedic Surgery

## 2024-03-08 SURGERY — DECOMPRESSIVE LUMBAR LAMINECTOMY LEVEL 3
Anesthesia: General

## 2024-03-09 ENCOUNTER — Ambulatory Visit (HOSPITAL_COMMUNITY): Payer: Self-pay | Admitting: Physician Assistant

## 2024-03-19 ENCOUNTER — Encounter (HOSPITAL_COMMUNITY): Payer: Self-pay | Admitting: Orthopedic Surgery

## 2024-03-19 ENCOUNTER — Other Ambulatory Visit: Payer: Self-pay

## 2024-03-19 NOTE — Progress Notes (Signed)
 SDW call  Patient was given pre-op instructions over the phone. Patient verbalized understanding of instructions provided. Denies SOB, fever, cough or chest pain.  States surgery scheduled on 03/08/2024 was cancelled due to insurance not approving.      PCP - Angeline Iba, FNP Cardiologist -  Pulmonary:    PPM/ICD - denies Device Orders - na Rep Notified - na   Chest x-ray - na EKG -  02/07/2024, CE, copy on chart Stress Test - ECHO -  Cardiac Cath -   Sleep Study/sleep apnea/CPAP: OSA, does not use CPAP, unable to tolerate  Non-diabetic  GLP1: Zepbound, states last dose 02/23/2024  Blood Thinner Instructions: denies Aspirin Instructions:denies   ERAS Protcol - Clears until 0430   Anesthesia review: Yes. HTN, OSA, cardiac clearance.  Isaiah reviewed 03/03/2024 and today  Your procedure is scheduled on Monday March 22, 2024  Report to [facility] Main Entrance A at  0530  A.M., then check in with the Admitting office.  Call this number if you have problems the morning of surgery:  [phone]   If you have any questions prior to your surgery date call [phone]: Open Monday-Friday 8am-4pm If you experience any cold or flu symptoms such as cough, fever, chills, shortness of breath, etc. between now and your scheduled surgery, please notify us  at the above number     Remember:  Do not eat after midnight the night before your surgery  You may drink clear liquids until  0430   the morning of your surgery.   Clear liquids allowed are: Water, Non-Citrus Juices (without pulp), Carbonated Beverages, Clear Tea, Black Coffee ONLY (NO MILK, CREAM OR POWDERED CREAMER of any kind), and Gatorade   Take these medicines the morning of surgery with A SIP OF WATER:  Cymbalta, robaxin, prilosec, ditropan   As needed: Tylenol , gabapentin   As of today, STOP taking any Aspirin (unless otherwise instructed by your surgeon) Aleve, Naproxen, Ibuprofen, Motrin, Advil, Goody's, BC's, all  herbal medications, fish oil, and all vitamins.

## 2024-03-19 NOTE — Anesthesia Preprocedure Evaluation (Addendum)
 Anesthesia Evaluation  Patient identified by MRN, date of birth, ID band Patient awake    Reviewed: Allergy & Precautions, NPO status , Patient's Chart, lab work & pertinent test results  Airway Mallampati: II  TM Distance: >3 FB Neck ROM: Full    Dental  (+) Dental Advisory Given, Edentulous Upper   Pulmonary sleep apnea , former smoker   Pulmonary exam normal breath sounds clear to auscultation       Cardiovascular hypertension, Pt. on medications (-) angina (-) CAD and (-) Past MI Normal cardiovascular exam Rhythm:Regular Rate:Normal     Neuro/Psych  PSYCHIATRIC DISORDERS  Depression    lumbar spinal stenosis L2-5 with neurogenic claudication S/p ACDF    GI/Hepatic Neg liver ROS,GERD  Medicated,,  Endo/Other  Obesity   Renal/GU negative Renal ROS     Musculoskeletal  (+) Arthritis ,    Abdominal   Peds  Hematology negative hematology ROS (+)   Anesthesia Other Findings Day of surgery medications reviewed with the patient.  Reproductive/Obstetrics                              Anesthesia Physical Anesthesia Plan  ASA: 2  Anesthesia Plan: General   Post-op Pain Management: Ofirmev  IV (intra-op)*, Gabapentin  PO (pre-op)* and Ketamine IV*   Induction: Intravenous  PONV Risk Score and Plan: 3 and Dexamethasone  and Ondansetron   Airway Management Planned: Oral ETT  Additional Equipment: ClearSight  Intra-op Plan:   Post-operative Plan: Extubation in OR  Informed Consent: I have reviewed the patients History and Physical, chart, labs and discussed the procedure including the risks, benefits and alternatives for the proposed anesthesia with the patient or authorized representative who has indicated his/her understanding and acceptance.     Dental advisory given  Plan Discussed with: CRNA  Anesthesia Plan Comments: (PAT note written by Allison Zelenak, PA-C for date of  service 03/03/2024. Surgery was apparently rescheduled to 03/22/2024. CBC is without 30 days. Needs BMP and updated T&S.  2nd PIV after induction)         Anesthesia Quick Evaluation

## 2024-03-22 ENCOUNTER — Other Ambulatory Visit: Payer: Self-pay

## 2024-03-22 ENCOUNTER — Encounter (HOSPITAL_COMMUNITY): Payer: Self-pay | Admitting: Orthopedic Surgery

## 2024-03-22 ENCOUNTER — Inpatient Hospital Stay (HOSPITAL_COMMUNITY): Payer: Self-pay | Admitting: Vascular Surgery

## 2024-03-22 ENCOUNTER — Encounter (HOSPITAL_COMMUNITY)
Admission: RE | Disposition: A | Discharge status: Home / Self Care | Payer: Self-pay | Source: Home / Self Care | Attending: Orthopedic Surgery

## 2024-03-22 ENCOUNTER — Inpatient Hospital Stay (HOSPITAL_COMMUNITY)

## 2024-03-22 ENCOUNTER — Inpatient Hospital Stay (HOSPITAL_COMMUNITY)
Admission: RE | Admit: 2024-03-22 | Discharge: 2024-03-23 | DRG: 448 | Disposition: A | Attending: Orthopedic Surgery | Admitting: Orthopedic Surgery

## 2024-03-22 DIAGNOSIS — E7800 Pure hypercholesterolemia, unspecified: Secondary | ICD-10-CM | POA: Diagnosis present

## 2024-03-22 DIAGNOSIS — F32A Depression, unspecified: Secondary | ICD-10-CM | POA: Diagnosis present

## 2024-03-22 DIAGNOSIS — Z87891 Personal history of nicotine dependence: Secondary | ICD-10-CM | POA: Diagnosis not present

## 2024-03-22 DIAGNOSIS — M419 Scoliosis, unspecified: Secondary | ICD-10-CM | POA: Diagnosis present

## 2024-03-22 DIAGNOSIS — M4800 Spinal stenosis, site unspecified: Principal | ICD-10-CM | POA: Diagnosis present

## 2024-03-22 DIAGNOSIS — Z6837 Body mass index (BMI) 37.0-37.9, adult: Secondary | ICD-10-CM | POA: Diagnosis not present

## 2024-03-22 DIAGNOSIS — G4730 Sleep apnea, unspecified: Secondary | ICD-10-CM | POA: Diagnosis present

## 2024-03-22 DIAGNOSIS — M48062 Spinal stenosis, lumbar region with neurogenic claudication: Secondary | ICD-10-CM

## 2024-03-22 DIAGNOSIS — E669 Obesity, unspecified: Secondary | ICD-10-CM | POA: Diagnosis present

## 2024-03-22 DIAGNOSIS — Z79899 Other long term (current) drug therapy: Secondary | ICD-10-CM | POA: Diagnosis not present

## 2024-03-22 DIAGNOSIS — K219 Gastro-esophageal reflux disease without esophagitis: Secondary | ICD-10-CM | POA: Diagnosis present

## 2024-03-22 DIAGNOSIS — Z791 Long term (current) use of non-steroidal anti-inflammatories (NSAID): Secondary | ICD-10-CM

## 2024-03-22 DIAGNOSIS — I10 Essential (primary) hypertension: Secondary | ICD-10-CM | POA: Diagnosis present

## 2024-03-22 DIAGNOSIS — Z7985 Long-term (current) use of injectable non-insulin antidiabetic drugs: Secondary | ICD-10-CM | POA: Diagnosis not present

## 2024-03-22 HISTORY — PX: DECOMPRESSIVE LUMBAR LAMINECTOMY LEVEL 3: SHX5793

## 2024-03-22 LAB — BASIC METABOLIC PANEL WITH GFR
Anion gap: 11 (ref 5–15)
BUN: 15 mg/dL (ref 8–23)
CO2: 25 mmol/L (ref 22–32)
Calcium: 9.5 mg/dL (ref 8.9–10.3)
Chloride: 102 mmol/L (ref 98–111)
Creatinine, Ser: 0.72 mg/dL (ref 0.44–1.00)
GFR, Estimated: >60 mL/min (ref >60)
Glucose, Bld: 112 mg/dL — ABNORMAL HIGH (ref 70–99)
Potassium: 3.7 mmol/L (ref 3.5–5.1)
Sodium: 138 mmol/L (ref 135–145)

## 2024-03-22 SURGERY — DECOMPRESSIVE LUMBAR LAMINECTOMY LEVEL 3
Anesthesia: General | Site: Spine Lumbar

## 2024-03-22 MED ORDER — THROMBIN 20000 UNITS EX SOLR
CUTANEOUS | Status: DC | PRN
Start: 2024-03-22 — End: 2024-03-22
  Administered 2024-03-22: 20000 [IU] via TOPICAL

## 2024-03-22 MED ORDER — METHYLPREDNISOLONE ACETATE 40 MG/ML IJ SUSP
INTRAMUSCULAR | Status: AC
  Filled 2024-03-22: qty 1

## 2024-03-22 MED ORDER — LIDOCAINE 2% (20 MG/ML) 5 ML SYRINGE
INTRAMUSCULAR | Status: DC | PRN
  Administered 2024-03-22: 100 mg via INTRAVENOUS

## 2024-03-22 MED ORDER — MIDAZOLAM HCL 2 MG/2ML IJ SOLN
INTRAMUSCULAR | Status: AC
  Filled 2024-03-22: qty 2

## 2024-03-22 MED ORDER — FENTANYL CITRATE (PF) 100 MCG/2ML IJ SOLN: 25.0000 ug | INTRAMUSCULAR | Status: DC | PRN

## 2024-03-22 MED ORDER — LIDOCAINE 2% (20 MG/ML) 5 ML SYRINGE
INTRAMUSCULAR | Status: AC
  Filled 2024-03-22: qty 5

## 2024-03-22 MED ORDER — OXYCODONE-ACETAMINOPHEN 10-325 MG PO TABS: 1.0000 | ORAL_TABLET | Freq: Four times a day (QID) | ORAL | 0 refills | Status: AC | PRN

## 2024-03-22 MED ORDER — DEXAMETHASONE SODIUM PHOSPHATE 10 MG/ML IJ SOLN
INTRAMUSCULAR | Status: AC
  Filled 2024-03-22: qty 1

## 2024-03-22 MED ORDER — ROSUVASTATIN CALCIUM 20 MG PO TABS
40.0000 mg | ORAL_TABLET | Freq: Every day | ORAL | Status: DC
  Administered 2024-03-22: 40 mg via ORAL
  Filled 2024-03-22: qty 2

## 2024-03-22 MED ORDER — CHLORHEXIDINE GLUCONATE 0.12 % MT SOLN: 15.0000 mL | Freq: Once | OROMUCOSAL | Status: DC

## 2024-03-22 MED ORDER — AMISULPRIDE (ANTIEMETIC) 5 MG/2ML IV SOLN
10.0000 mg | Freq: Once | INTRAVENOUS | Status: DC
Start: 2024-03-22 — End: 2024-03-22
  Administered 2024-03-22: 10 mg via INTRAVENOUS

## 2024-03-22 MED ORDER — TRANEXAMIC ACID-NACL 1000-0.7 MG/100ML-% IV SOLN
1000.0000 mg | INTRAVENOUS | Status: AC
  Administered 2024-03-22: 1000 mg via INTRAVENOUS
  Filled 2024-03-22: qty 100

## 2024-03-22 MED ORDER — 0.9 % SODIUM CHLORIDE (POUR BTL) OPTIME
TOPICAL | Status: DC | PRN
  Administered 2024-03-22: 1000 mL

## 2024-03-22 MED ORDER — ROCURONIUM BROMIDE 10 MG/ML (PF) SYRINGE
PREFILLED_SYRINGE | INTRAVENOUS | Status: AC
  Filled 2024-03-22: qty 10

## 2024-03-22 MED ORDER — ONDANSETRON HCL 4 MG PO TABS: 4.0000 mg | ORAL_TABLET | Freq: Four times a day (QID) | ORAL | Status: DC | PRN

## 2024-03-22 MED ORDER — PROPOFOL 10 MG/ML IV BOLUS
INTRAVENOUS | Status: DC | PRN
  Administered 2024-03-22: 150 mg via INTRAVENOUS

## 2024-03-22 MED ORDER — OXYCODONE HCL 5 MG PO TABS
10.0000 mg | ORAL_TABLET | ORAL | Status: DC | PRN
  Administered 2024-03-22 – 2024-03-23 (×5): 10 mg via ORAL
  Filled 2024-03-22 (×6): qty 2

## 2024-03-22 MED ORDER — AMISULPRIDE (ANTIEMETIC) 5 MG/2ML IV SOLN
INTRAVENOUS | Status: AC
  Filled 2024-03-22: qty 4

## 2024-03-22 MED ORDER — PHENYLEPHRINE HCL-NACL 20-0.9 MG/250ML-% IV SOLN
INTRAVENOUS | Status: DC | PRN
  Administered 2024-03-22: 30 ug/min via INTRAVENOUS

## 2024-03-22 MED ORDER — METHOCARBAMOL 1000 MG/10ML IJ SOLN
500.0000 mg | Freq: Four times a day (QID) | INTRAMUSCULAR | Status: DC | PRN
  Filled 2024-03-22: qty 5

## 2024-03-22 MED ORDER — MENTHOL 3 MG MT LOZG: 1.0000 | LOZENGE | OROMUCOSAL | Status: DC | PRN

## 2024-03-22 MED ORDER — GABAPENTIN 300 MG PO CAPS
300.0000 mg | ORAL_CAPSULE | Freq: Two times a day (BID) | ORAL | Status: DC
  Administered 2024-03-22 – 2024-03-23 (×2): 300 mg via ORAL
  Filled 2024-03-22 (×2): qty 1

## 2024-03-22 MED ORDER — PHENYLEPHRINE 80 MCG/ML (10ML) SYRINGE FOR IV PUSH (FOR BLOOD PRESSURE SUPPORT)
PREFILLED_SYRINGE | INTRAVENOUS | Status: DC | PRN
  Administered 2024-03-22 (×3): 160 ug via INTRAVENOUS

## 2024-03-22 MED ORDER — ONDANSETRON HCL 4 MG PO TABS: 4.0000 mg | ORAL_TABLET | Freq: Three times a day (TID) | ORAL | 0 refills | Status: AC | PRN

## 2024-03-22 MED ORDER — SUGAMMADEX SODIUM 200 MG/2ML IV SOLN
INTRAVENOUS | Status: DC | PRN
  Administered 2024-03-22: 200 mg via INTRAVENOUS
  Administered 2024-03-22: 100 mg via INTRAVENOUS

## 2024-03-22 MED ORDER — ROCURONIUM BROMIDE 10 MG/ML (PF) SYRINGE
PREFILLED_SYRINGE | INTRAVENOUS | Status: DC | PRN
  Administered 2024-03-22: 20 mg via INTRAVENOUS
  Administered 2024-03-22: 30 mg via INTRAVENOUS
  Administered 2024-03-22: 40 mg via INTRAVENOUS
  Administered 2024-03-22: 60 mg via INTRAVENOUS
  Administered 2024-03-22: 20 mg via INTRAVENOUS

## 2024-03-22 MED ORDER — SODIUM CHLORIDE 0.9% FLUSH: 3.0000 mL | INTRAVENOUS | Status: DC | PRN

## 2024-03-22 MED ORDER — THROMBIN 20000 UNITS EX SOLR
CUTANEOUS | Status: AC
Start: 2024-03-22 — End: 2024-03-22
  Filled 2024-03-22: qty 20000

## 2024-03-22 MED ORDER — LACTATED RINGERS IV SOLN: INTRAVENOUS | Status: DC

## 2024-03-22 MED ORDER — CHLORHEXIDINE GLUCONATE 0.12 % MT SOLN
15.0000 mL | Freq: Once | OROMUCOSAL | Status: AC
  Administered 2024-03-22: 15 mL via OROMUCOSAL
  Filled 2024-03-22: qty 15

## 2024-03-22 MED ORDER — THROMBIN 20000 UNITS EX SOLR
CUTANEOUS | Status: AC
  Filled 2024-03-22: qty 20000

## 2024-03-22 MED ORDER — OXYCODONE HCL 5 MG PO TABS
5.0000 mg | ORAL_TABLET | ORAL | Status: DC | PRN
  Administered 2024-03-23: 5 mg via ORAL

## 2024-03-22 MED ORDER — ACETAMINOPHEN 650 MG RE SUPP: 650.0000 mg | RECTAL | Status: DC | PRN

## 2024-03-22 MED ORDER — OXYBUTYNIN CHLORIDE 5 MG PO TABS
10.0000 mg | ORAL_TABLET | Freq: Two times a day (BID) | ORAL | Status: DC
  Administered 2024-03-22 – 2024-03-23 (×2): 10 mg via ORAL
  Filled 2024-03-22 (×3): qty 2

## 2024-03-22 MED ORDER — HYDROMORPHONE HCL 1 MG/ML IJ SOLN: 1.0000 mg | INTRAMUSCULAR | Status: AC | PRN

## 2024-03-22 MED ORDER — KETAMINE HCL 50 MG/5ML IJ SOSY
PREFILLED_SYRINGE | INTRAMUSCULAR | Status: AC
  Filled 2024-03-22: qty 5

## 2024-03-22 MED ORDER — ORAL CARE MOUTH RINSE: 15.0000 mL | Freq: Once | OROMUCOSAL | Status: DC

## 2024-03-22 MED ORDER — DEXAMETHASONE SODIUM PHOSPHATE 10 MG/ML IJ SOLN
INTRAMUSCULAR | Status: DC | PRN
  Administered 2024-03-22: 10 mg via INTRAVENOUS

## 2024-03-22 MED ORDER — LACTATED RINGERS IV SOLN
INTRAVENOUS | Status: DC
Start: 2024-03-22 — End: 2024-03-23

## 2024-03-22 MED ORDER — PHENOL 1.4 % MT LIQD: 1.0000 | OROMUCOSAL | Status: DC | PRN

## 2024-03-22 MED ORDER — CEFAZOLIN SODIUM-DEXTROSE 2-4 GM/100ML-% IV SOLN: 2.0000 g | INTRAVENOUS | Status: DC

## 2024-03-22 MED ORDER — LACTATED RINGERS IV SOLN
INTRAVENOUS | Status: DC
Start: 2024-03-22 — End: 2024-03-22

## 2024-03-22 MED ORDER — PROPOFOL 10 MG/ML IV BOLUS
INTRAVENOUS | Status: AC
  Filled 2024-03-22: qty 20

## 2024-03-22 MED ORDER — SODIUM CHLORIDE 0.9 % IV SOLN: 250.0000 mL | INTRAVENOUS | Status: AC

## 2024-03-22 MED ORDER — POLYETHYLENE GLYCOL 3350 17 G PO PACK: 17.0000 g | PACK | Freq: Every day | ORAL | Status: DC | PRN

## 2024-03-22 MED ORDER — GABAPENTIN 300 MG PO CAPS: 300.0000 mg | ORAL_CAPSULE | Freq: Three times a day (TID) | ORAL | 0 refills | Status: AC | PRN

## 2024-03-22 MED ORDER — PANTOPRAZOLE SODIUM 40 MG PO TBEC
40.0000 mg | DELAYED_RELEASE_TABLET | Freq: Every day | ORAL | Status: DC
  Administered 2024-03-22: 40 mg via ORAL
  Filled 2024-03-22: qty 1

## 2024-03-22 MED ORDER — MIDAZOLAM HCL 2 MG/2ML IJ SOLN
INTRAMUSCULAR | Status: DC | PRN
  Administered 2024-03-22: 2 mg via INTRAVENOUS

## 2024-03-22 MED ORDER — EPHEDRINE SULFATE-NACL 50-0.9 MG/10ML-% IV SOSY
PREFILLED_SYRINGE | INTRAVENOUS | Status: DC | PRN
  Administered 2024-03-22: 5 mg via INTRAVENOUS
  Administered 2024-03-22 (×2): 10 mg via INTRAVENOUS

## 2024-03-22 MED ORDER — BUPIVACAINE-EPINEPHRINE (PF) 0.25% -1:200000 IJ SOLN
INTRAMUSCULAR | Status: AC
  Filled 2024-03-22: qty 30

## 2024-03-22 MED ORDER — CEFAZOLIN SODIUM-DEXTROSE 2-4 GM/100ML-% IV SOLN
2.0000 g | INTRAVENOUS | Status: AC
  Administered 2024-03-22 (×2): 2 g via INTRAVENOUS
  Filled 2024-03-22: qty 100

## 2024-03-22 MED ORDER — MAGNESIUM CITRATE PO SOLN
1.0000 | Freq: Once | ORAL | Status: AC | PRN
  Administered 2024-03-23: 1 via ORAL
  Filled 2024-03-22 (×2): qty 296

## 2024-03-22 MED ORDER — ONDANSETRON HCL 4 MG/2ML IJ SOLN
INTRAMUSCULAR | Status: DC | PRN
  Administered 2024-03-22: 4 mg via INTRAVENOUS

## 2024-03-22 MED ORDER — EPHEDRINE 5 MG/ML INJ
INTRAVENOUS | Status: AC
  Filled 2024-03-22: qty 5

## 2024-03-22 MED ORDER — ORAL CARE MOUTH RINSE: 15.0000 mL | Freq: Once | OROMUCOSAL | Status: AC

## 2024-03-22 MED ORDER — ONDANSETRON HCL 4 MG/2ML IJ SOLN: 4.0000 mg | Freq: Four times a day (QID) | INTRAMUSCULAR | Status: DC | PRN

## 2024-03-22 MED ORDER — SODIUM CHLORIDE 0.9% FLUSH
3.0000 mL | Freq: Two times a day (BID) | INTRAVENOUS | Status: DC
  Administered 2024-03-22 (×2): 3 mL via INTRAVENOUS

## 2024-03-22 MED ORDER — ONDANSETRON HCL 4 MG/2ML IJ SOLN
INTRAMUSCULAR | Status: AC
  Filled 2024-03-22: qty 2

## 2024-03-22 MED ORDER — FENTANYL CITRATE (PF) 250 MCG/5ML IJ SOLN
INTRAMUSCULAR | Status: DC | PRN
  Administered 2024-03-22: 25 ug via INTRAVENOUS
  Administered 2024-03-22: 100 ug via INTRAVENOUS
  Administered 2024-03-22: 50 ug via INTRAVENOUS

## 2024-03-22 MED ORDER — ACETAMINOPHEN 325 MG PO TABS: 650.0000 mg | ORAL_TABLET | ORAL | Status: DC | PRN

## 2024-03-22 MED ORDER — KETAMINE HCL 10 MG/ML IJ SOLN
INTRAMUSCULAR | Status: DC | PRN
Start: 2024-03-22 — End: 2024-03-22
  Administered 2024-03-22: 30 mg via INTRAVENOUS

## 2024-03-22 MED ORDER — TRANEXAMIC ACID-NACL 1000-0.7 MG/100ML-% IV SOLN
1000.0000 mg | INTRAVENOUS | Status: DC
Start: 2024-03-22 — End: 2024-03-22

## 2024-03-22 MED ORDER — THROMBIN 20000 UNITS EX SOLR
CUTANEOUS | Status: DC | PRN
  Administered 2024-03-22: 20 mL via TOPICAL

## 2024-03-22 MED ORDER — VANCOMYCIN HCL 1000 MG IV SOLR
INTRAVENOUS | Status: DC | PRN
  Administered 2024-03-22: 1000 mg

## 2024-03-22 MED ORDER — HYDROCHLOROTHIAZIDE 25 MG PO TABS
25.0000 mg | ORAL_TABLET | Freq: Every day | ORAL | Status: DC
  Administered 2024-03-23: 25 mg via ORAL
  Filled 2024-03-22: qty 1

## 2024-03-22 MED ORDER — SENNOSIDES-DOCUSATE SODIUM 8.6-50 MG PO TABS
1.0000 | ORAL_TABLET | Freq: Two times a day (BID) | ORAL | Status: DC
  Administered 2024-03-22 – 2024-03-23 (×2): 1 via ORAL
  Filled 2024-03-22 (×2): qty 1

## 2024-03-22 MED ORDER — THROMBIN 5000 UNITS EX KIT
PACK | CUTANEOUS | Status: AC
Start: 2024-03-22 — End: 2024-03-22
  Filled 2024-03-22: qty 1

## 2024-03-22 MED ORDER — VANCOMYCIN HCL 1000 MG IV SOLR
INTRAVENOUS | Status: AC
  Filled 2024-03-22: qty 20

## 2024-03-22 MED ORDER — ALBUMIN HUMAN 5 % IV SOLN: INTRAVENOUS | Status: DC | PRN

## 2024-03-22 MED ORDER — BUPIVACAINE-EPINEPHRINE (PF) 0.25% -1:200000 IJ SOLN
INTRAMUSCULAR | Status: DC | PRN
  Administered 2024-03-22: 30 mL via PERINEURAL

## 2024-03-22 MED ORDER — ONDANSETRON HCL 4 MG/2ML IJ SOLN: 4.0000 mg | Freq: Once | INTRAMUSCULAR | Status: DC | PRN

## 2024-03-22 MED ORDER — CEFAZOLIN SODIUM-DEXTROSE 1-4 GM/50ML-% IV SOLN
1.0000 g | Freq: Three times a day (TID) | INTRAVENOUS | Status: AC
  Administered 2024-03-22 – 2024-03-23 (×2): 1 g via INTRAVENOUS
  Filled 2024-03-22 (×2): qty 50

## 2024-03-22 MED ORDER — FENTANYL CITRATE (PF) 250 MCG/5ML IJ SOLN
INTRAMUSCULAR | Status: AC
  Filled 2024-03-22: qty 5

## 2024-03-22 MED ORDER — PHENYLEPHRINE 80 MCG/ML (10ML) SYRINGE FOR IV PUSH (FOR BLOOD PRESSURE SUPPORT)
PREFILLED_SYRINGE | INTRAVENOUS | Status: AC
  Filled 2024-03-22: qty 10

## 2024-03-22 MED ORDER — DULOXETINE HCL 60 MG PO CPEP
60.0000 mg | ORAL_CAPSULE | Freq: Two times a day (BID) | ORAL | Status: DC
  Administered 2024-03-22 – 2024-03-23 (×2): 60 mg via ORAL
  Filled 2024-03-22 (×2): qty 1

## 2024-03-22 MED ORDER — THROMBIN 5000 UNITS EX SOLR
CUTANEOUS | Status: DC | PRN
  Administered 2024-03-22: 5000 [IU] via TOPICAL

## 2024-03-22 MED ORDER — METHOCARBAMOL 500 MG PO TABS
500.0000 mg | ORAL_TABLET | Freq: Four times a day (QID) | ORAL | Status: DC | PRN
  Administered 2024-03-22 – 2024-03-23 (×4): 500 mg via ORAL
  Filled 2024-03-22 (×4): qty 1

## 2024-03-22 SURGICAL SUPPLY — 44 items
BAG COUNTER SPONGE SURGICOUNT (BAG) ×1 IMPLANT
BUR EGG ELITE 4.0 (BURR) IMPLANT
CANISTER SUCTION 3000ML PPV (SUCTIONS) ×1 IMPLANT
CLSR STERI-STRIP ANTIMIC 1/2X4 (GAUZE/BANDAGES/DRESSINGS) ×1 IMPLANT
CORD BIPOLAR FORCEPS 12FT (ELECTRODE) ×1 IMPLANT
COVER SURGICAL LIGHT HANDLE (MISCELLANEOUS) ×1 IMPLANT
DRAPE POUCH INSTRU U-SHP 10X18 (DRAPES) ×1 IMPLANT
DRAPE SURG 17X23 STRL (DRAPES) ×1 IMPLANT
DRAPE U-SHAPE 47X51 STRL (DRAPES) ×1 IMPLANT
DRSG OPSITE POSTOP 4X8 (GAUZE/BANDAGES/DRESSINGS) IMPLANT
DURAPREP 26ML APPLICATOR (WOUND CARE) ×1 IMPLANT
ELECT CAUTERY BLADE 6.4 (BLADE) ×1 IMPLANT
ELECT PENCIL ROCKER SW 15FT (MISCELLANEOUS) ×1 IMPLANT
ELECTRODE REM PT RTRN 9FT ADLT (ELECTROSURGICAL) ×1 IMPLANT
GLOVE BIO SURGEON STRL SZ 6.5 (GLOVE) ×1 IMPLANT
GLOVE BIOGEL PI IND STRL 6.5 (GLOVE) ×1 IMPLANT
GLOVE BIOGEL PI IND STRL 8.5 (GLOVE) ×1 IMPLANT
GLOVE SS BIOGEL STRL SZ 8.5 (GLOVE) ×1 IMPLANT
GOWN STRL REUS W/ TWL LRG LVL3 (GOWN DISPOSABLE) ×1 IMPLANT
GOWN STRL REUS W/TWL 2XL LVL3 (GOWN DISPOSABLE) ×2 IMPLANT
KIT BASIN OR (CUSTOM PROCEDURE TRAY) ×1 IMPLANT
KIT TURNOVER KIT B (KITS) ×1 IMPLANT
NDL 22X1.5 STRL (OR ONLY) (MISCELLANEOUS) ×1 IMPLANT
NDL SPNL 18GX3.5 QUINCKE PK (NEEDLE) ×2 IMPLANT
NEEDLE 22X1.5 STRL (OR ONLY) (MISCELLANEOUS) ×1 IMPLANT
NEEDLE SPNL 18GX3.5 QUINCKE PK (NEEDLE) ×2 IMPLANT
PACK LAMINECTOMY ORTHO (CUSTOM PROCEDURE TRAY) ×1 IMPLANT
PACK UNIVERSAL I (CUSTOM PROCEDURE TRAY) ×1 IMPLANT
PAD ARMBOARD POSITIONER FOAM (MISCELLANEOUS) ×2 IMPLANT
PATTIES SURGICAL .5 X1 (DISPOSABLE) IMPLANT
SOLN 0.9% NACL 1000 ML (IV SOLUTION) ×1 IMPLANT
SOLN 0.9% NACL POUR BTL 1000ML (IV SOLUTION) ×1 IMPLANT
SOLN STERILE WATER 1000 ML (IV SOLUTION) ×1 IMPLANT
SOLN STERILE WATER BTL 1000 ML (IV SOLUTION) ×1 IMPLANT
SURGIFLO W/THROMBIN 8M KIT (HEMOSTASIS) IMPLANT
SUT BONE WAX W31G (SUTURE) ×1 IMPLANT
SUT MNCRL AB 3-0 PS2 27 (SUTURE) ×1 IMPLANT
SUT VIC AB 1 CT1 18XCR BRD 8 (SUTURE) ×1 IMPLANT
SUT VIC AB 2-0 CT1 18 (SUTURE) ×1 IMPLANT
SYR 3ML LL SCALE MARK (SYRINGE) IMPLANT
SYR BULB IRRIG 60ML STRL (SYRINGE) ×1 IMPLANT
SYR CONTROL 10ML LL (SYRINGE) ×1 IMPLANT
TOWEL GREEN STERILE (TOWEL DISPOSABLE) ×1 IMPLANT
TOWEL GREEN STERILE FF (TOWEL DISPOSABLE) ×1 IMPLANT

## 2024-03-22 NOTE — Anesthesia Postprocedure Evaluation (Signed)
 Anesthesia Post Note  Patient: Jacqueline Spencer  Procedure(s) Performed: LUMBAR DECOMPRESSION WITH IN SITU FUSION OF LUMBAR TWO TO LUMBAR THREE, LUMBAR THREE TO LUMBAR FOUR, AND LUMBAR FOUR TO LUMBAR FIVE (Spine Lumbar)     Patient location during evaluation: PACU Anesthesia Type: General Level of consciousness: awake and alert Pain management: pain level controlled Vital Signs Assessment: post-procedure vital signs reviewed and stable Respiratory status: spontaneous breathing, nonlabored ventilation, respiratory function stable and patient connected to nasal cannula oxygen Cardiovascular status: blood pressure returned to baseline and stable Postop Assessment: no apparent nausea or vomiting Anesthetic complications: no   No notable events documented.  Last Vitals:  Vitals:   03/22/24 1245 03/22/24 1317  BP: 125/61 134/64  Pulse: 84 84  Resp: 18 20  Temp: 36.7 C 36.5 C  SpO2: 95% 97%    Last Pain:  Vitals:   03/22/24 1408  TempSrc:   PainSc: 8                  Garnette FORBES Skillern

## 2024-03-22 NOTE — Anesthesia Procedure Notes (Signed)
 Procedure Name: Intubation Date/Time: 03/22/2024 7:39 AM  Performed by: Jolynn Mage, CRNAPre-anesthesia Checklist: Patient identified, Patient being monitored, Timeout performed, Emergency Drugs available and Suction available Patient Re-evaluated:Patient Re-evaluated prior to induction Oxygen Delivery Method: Circle System Utilized Preoxygenation: Pre-oxygenation with 100% oxygen Induction Type: IV induction Ventilation: Mask ventilation without difficulty Laryngoscope Size: Miller and 2 Grade View: Grade I Tube type: Oral Tube size: 7.0 mm Number of attempts: 1 Airway Equipment and Method: Stylet Placement Confirmation: ETT inserted through vocal cords under direct vision, positive ETCO2 and breath sounds checked- equal and bilateral Secured at: 21 cm Tube secured with: Tape Dental Injury: Teeth and Oropharynx as per pre-operative assessment

## 2024-03-22 NOTE — Op Note (Signed)
 OPERATIVE REPORT  DATE OF SURGERY: 03/22/2024  PATIENT NAME:  Jacqueline Spencer MRN: [id number] DOB: 21-Jan-1951  PCP: Silvano Angeline FALCON, NP  PRE-OPERATIVE DIAGNOSIS: Lumbar spinal stenosis with neurogenic claudication L2-5  POST-OPERATIVE DIAGNOSIS: Same  PROCEDURE:   Lumbar decompression L2-5 with medial facetectomies and foraminotomies Posterior lateral arthrodesis with autograft bone (in situ fusion) L2-5.  SURGEON:  Donaciano Sprang, MD  PHYSICIAN ASSISTANT: Jeoffrey Sages, PA  ANESTHESIA:   General  EBL: 200 ml   Complications: None  Graft: Autograft from decompression  BRIEF HISTORY: Jacqueline Spencer is a 73 y.o. female who presented to my office with complaints of severe back buttock and bilateral lower extremity pain.  Clinical exam was consistent with lumbar spinal stenosis with neurogenic claudication.  Imaging confirmed this as well as slight degenerative scoliosis.  As a result of the failure to improve with conservative management with progressive loss and quality of life we elected to move forward with surgery.  All appropriate risks, benefits, and alternatives to surgery were discussed with the patient and consent was obtained.  PROCEDURE DETAILS: Patient was brought into the operating room and was properly positioned on the operating room table.  After induction with general anesthesia the patient was endotracheally intubated.  A timeout was taken to confirm all important data: including patient, procedure, and the level. Teds, SCD's were applied.   A Foley was placed by the nurse and the patient was turned prone onto the Wilson frame.  All bony prominences were well-padded and the back was prepped and draped in a standard fashion.  Two 18-gauge needles were then inserted and an x-ray were taken for localization of our incision.  I marked out my incision site to expand from L2 to the inferior aspect of L5.  I infiltrated the incision site with quarter percent Marcaine with  epinephrine .  I then made an incision in the midline and sharply dissected down to the deep fascia.  I exposed and then incised the deep fascia to strip the paraspinal muscles to expose the L2, L3, L4, and L5 spinous processes as well as the lamina.  The facet capsules were also identified and exposed.  I then placed a Penfield 4 under the lamina of L4 and took an x-ray to confirm that it was at the appropriate level.  Once I confirmed the appropriate level I then proceeded to remove the facet capsule at L4-5, and L3-4.  The transverse processes of L2, L3, and L4 were also exposed.  Once I had the posterior lateral gutter exposed bilaterally and the appropriate level identified I proceeded with my decompression.  The spinous process of L4 was resected in its entirety.  Using Kerrison rongeurs as well as a Chartered loss adjuster I performed a generous laminotomy of L4.  I then used my Penfield 4 to dissect through the central raphae of the ligamentum flavum until I was able to develop a plane between the thecal sac and the ligamentum flavum.  There was significant thickening/bulking of the ligamentum flavum that was contributing to the spinal stenosis.  This was consistent with what was seen on the preoperative MRI.  Using Kerrison rongeurs and a Penfield 4 to maintain my plane I resected the ligamentum flavum centrally.  Working towards the contralateral side I went into the lateral recess and performed a generous medial facetectomy and decompressed until I could see the medial wall of the L5 pedicle.  The L5 nerve root came into view and then I performed an L5 foraminotomy.  I then resected the leading edge of the L5 spinous process to further decompress the thecal sac.  I then proceeded superiorly in the lateral recess until I could palpate the inferior aspect of the L4 pedicle.  Once the side of the decompression was complete I then went and switched sides to work on the contralateral side.  Using the  same technique I performed a medial facetectomy and foraminotomy.  The leading edge of the L5 lamina was also resected to adequately decompress the L4-5 level bilaterally.  Epidural veins were identified and coagulated.  The L3 spinous process was resected in a similar fashion with similar central decompression as was done at L4-5.  I again worked into the lateral recess until I had an adequate visualization of the thecal sac and my central decompression was complete.  Once this was done I was able to pass a neuro patty under the remaining portion of the L4 lamina.  Once I had this neuropathic in place I could now resect the remaining portion of the L4 lamina and complete my L4 laminectomy.  Once the laminectomy was completed I could then work into the lateral recess continuing my decompression superiorly.  Again I formed a generous medial facetectomy and foraminotomy and decompress laterally until the medial wall of the pedicle was visualized.  This was done bilaterally.  Once I had the L3-4 decompression complete I then proceeded to the L2-3 level.  The inferior portion of the L2 spinous process was resected and I performed a central laminotomy with Kerrison rongeurs.  I then remove the ligamentum flavum with Kerrison rongeurs to expose the dorsal surface of the thecal sac.  I again placed the neural patty under the remaining L3 lamina to protect the thecal sac.  Once this was then placed I completed my L3 laminectomy.  This allowed me to complete my L2-3 decompression by performing a medial facetectomy and foraminotomy.  At this point I could now easily pass my Penfield 4 in the lateral recess bilaterally and thecal sac was adequately decompressed.  I could also palpate out each of the foramen and there was no further neural compression.  Once this decompression proved satisfactory I placed instruments at the cephalad and caudad fortune of my decompression and took a final x-ray.  This confirmed that my  decompression spanned the maximum area of stenosis is seen on the preoperative MRI.  The wound was now copiously irrigated with normal saline and hemostasis was obtained using proper electrocautery and Floseal.  A posterior lateral gutter was then prepared with a high-speed bur.  I decorticated the transverse process and facet complexes from L2-L5.  I then placed the autograft bone in the posterior lateral gutter bilaterally.  I then irrigated wound copiously again I did 1 final check of my decompression to confirm that was satisfactory.  I then placed a thrombin -soaked Gelfoam patty over the laminectomy.  And then placed powdered vancomycin in the wound.  Retractors were removed and I confirmed we had hemostasis.  I then closed the deep fascia with interrupted #1 Vicryl sutures.  Additional vancomycin powder was placed and the deep fascia was closed in a layered fashion with a running 0 Vicryl suture followed by interrupted 2-0 Vicryl sutures.  The skin was reapproximated with 3-0 Monocryl.  Steri-Strips and a dry dressing were applied and the patient was ultimately extubated and transferred to PACU without incident.  The end of the case all needle sponge counts were correct.  There are no adverse  intraoperative events.  First assistant was [employer], [place] who was instrumental in assisting with positioning and retraction visualization and wound closure.  Donaciano Sprang, MD 03/22/2024 11:18 AM

## 2024-03-22 NOTE — H&P (Signed)
 History: Jacqueline Spencer is a very pleasant 73 year old woman with significant back buttock and neuropathic lower extremity leg pain.  She has degenerative scoliosis as well as lumbar spinal stenosis.  As a result of the deformity and spinal stenosis we have elected to move forward with a multilevel lumbar decompression and in situ fusion.  The patient has expressed an understanding of the risks and benefits as well as a willingness to move forward with surgery.  The goals of surgery are to reduce her pain and improve her quality of life.   Past Medical History:  Diagnosis Date   Arthritis    Depression    High cholesterol    History of bladder surgery    Bladder Sling   Hypertension    Sleep apnea     No Known Allergies  No current facility-administered medications on file prior to encounter.   Current Outpatient Medications on File Prior to Encounter  Medication Sig Dispense Refill   acetaminophen  (TYLENOL ) 500 MG tablet Take 1,000-1,500 mg by mouth every 6 (six) hours as needed for mild pain.     DULoxetine (CYMBALTA) 60 MG capsule Take 60 mg by mouth 2 (two) times daily.     gabapentin  (NEURONTIN ) 300 MG capsule Take 300 mg by mouth 2 (two) times daily.      hydrochlorothiazide  (HYDRODIURIL ) 25 MG tablet Take 25 mg by mouth daily.     methocarbamol (ROBAXIN) 500 MG tablet Take 500 mg by mouth in the morning and at bedtime.     omeprazole (PRILOSEC) 40 MG capsule Take 40 mg by mouth daily.     oxybutynin  (DITROPAN ) 5 MG tablet Take 10 mg by mouth 2 (two) times daily.     rosuvastatin  (CRESTOR ) 40 MG tablet Take 40 mg by mouth at bedtime.      tirzepatide (ZEPBOUND) 2.5 MG/0.5ML Pen Inject 2.5 mg into the skin once a week.     cyclobenzaprine  (FLEXERIL ) 10 MG tablet Take 1 tablet (10 mg total) by mouth 3 (three) times daily as needed for muscle spasms. (Patient not taking: Reported on 02/27/2024) 30 tablet 0   meloxicam  (MOBIC ) 15 MG tablet Take 1 tablet (15 mg total) by mouth daily.  Restart on 01/23/20     oxyCODONE  (OXY IR/ROXICODONE ) 5 MG immediate release tablet Take 1 tablet (5 mg total) by mouth every 4 (four) hours as needed for moderate pain ((score 4 to 6)). (Patient not taking: Reported on 02/27/2024) 30 tablet 0   Pseudoeph-Doxylamine-DM-APAP (NYQUIL PO) Take 30 mLs by mouth at bedtime as needed (sleep).     solifenacin (VESICARE) 10 MG tablet Take 10 mg by mouth daily. (Patient not taking: Reported on 03/19/2024)      Physical Exam: Vitals:   03/22/24 0550  BP: 126/70  Pulse: 71  Resp: 18  Temp: 98.8 F (37.1 C)  SpO2: 96%   Body mass index is 37.59 kg/m. Clinical exam: Jacqueline Spencer is a pleasant individual, who appears younger than their stated age.  She is alert and orientated 3.  No shortness of breath, chest pain.  Abdomen is soft and non-tender, negative loss of bowel and bladder control, no rebound tenderness.  Negative: skin lesions abrasions contusions  Peripheral pulses: 2+ peripheral pulses in the lower extremity bilaterally. LE compartments are: Soft and nontender.  Gait pattern: Altered gait pattern due to lower back/gluteal pain radiating into the lower extremity. Pain and mobility are diminished when she extends the spine.  Assistive devices: None  Neuro:  5/5 motor strength in the lower extremity bilaterally. Positive numbness and dysesthesias diffusely in the lower extremity. Negative Babinski test, no clonus, negative nerve root tension signs in the lower extremity. Decreased sensation to light touch throughout the lower extremities bilaterally. 1+ deep tendon reflexes bilaterally.  Musculoskeletal: Progressive back buttock and thigh pain with extension or rotation of the lumbar spine. Relief in pain with forward flexion of the spine. No significant SI joint pain with palpation. No hip, knee, ankle pain with isolated joint range of motion.  Imaging: X-rays of the lumbar spine demonstrate multilevel degenerative disc disease with  approximately 10 degree degenerative scoliosis. There is no fracture or spondylolisthesis.  Lumbar MRI: Significant spinal stenosis L2-5. There is a large right asymmetrical disc bulge contributing to lateral recess and foraminal stenosis at L2-3. Severe central stenosis at L3-4 with severe left neuroforaminal stenosis. Moderate to severe central stenosis at L4-5 with severe left neuroforaminal narrowing. There is significant facet arthrosis at L5-S1 and severe left neuroforaminal stenosis.   A/P:  Jacqueline Spencer is a very pleasant 73 year old woman with significant back buttock and neuropathic leg pain for several years now. Patient's clinical exam and imaging studies are consistent with lumbar spinal stenosis with neurogenic claudication. The patient describes neuropathic pain predominantly L2, L3 and to a lesser extent L4 dermatomes. She does not have any significant numbness or dysesthesias in the S1 level. Imaging studies confirm multilevel spinal stenosis primarily L2-L5. While there is significant neuroforaminal stenosis on the left side at L5-S1 she is not complaining of significant left L5 radicular pain. Based on her clinical exam and complaints I believe the spinal stenosis from L1-2 to L5 is her primary issue.   At this point we had a long discussion about surgical intervention. I believe her primary issue is spinal stenosis and this can be passed with a multilevel lumbar decompression L2-5. Because of the pre-existing scoliosis I would then supplement this with an in situ fusion to prevent progression in the scoliosis. I have gone over the surgical procedure with the patient and her daughter in great detail and all of their questions were addressed. They have expressed an understanding of the risks and benefits as well as the goals of surgery.  Risks and benefits of lumbar decompression/discectomy: Infection, bleeding, death, stroke, paralysis, ongoing or worse pain, need for additional surgery, leak of  spinal fluid, adjacent segment degeneration requiring additional surgery, post-operative hematoma formation that can result in neurological compromise and the need for urgent/emergent re-operation. Loss in bowel and bladder control. Injury to major vessels that could result in the need for urgent abdominal surgery to stop bleeding. Risk of deep venous thrombosis (DVT) and the need for additional treatment. Recurrent disc herniation resulting in the need for revision surgery, which could include fusion surgery (utilizing instrumentation such as pedicle screws and intervertebral cages).

## 2024-03-22 NOTE — Brief Op Note (Signed)
 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

## 2024-03-22 NOTE — Discharge Instructions (Signed)

## 2024-03-22 NOTE — Plan of Care (Signed)

## 2024-03-22 NOTE — OR Nursing (Signed)
 0840 DR HALL, RADIOLOGIST, CALLED TO INFORM DR BROOKS CORRECT LOCATION FROM PORTABLE XRAY

## 2024-03-22 NOTE — Transfer of Care (Signed)
 Immediate Anesthesia Transfer of Care Note  Patient: Jacqueline Spencer  Procedure(s) Performed: LUMBAR DECOMPRESSION WITH IN SITU FUSION OF LUMBAR TWO TO LUMBAR THREE, LUMBAR THREE TO LUMBAR FOUR, AND LUMBAR FOUR TO LUMBAR FIVE (Spine Lumbar)  Patient Location: PACU  Anesthesia Type:General  Level of Consciousness: awake, alert , patient cooperative, and responds to stimulation  Airway & Oxygen Therapy: Patient Spontanous Breathing and Patient connected to face mask oxygen  Post-op Assessment: Report given to RN and Patient moving all extremities X 4  Post vital signs: Reviewed and stable  Last Vitals:  Vitals Value Taken Time  BP 115/50 03/22/24 12:08  Temp    Pulse 93 03/22/24 12:13  Resp 18 03/22/24 12:13  SpO2 99 % 03/22/24 12:13  Vitals shown include unfiled device data.  Last Pain:  Vitals:   03/22/24 0618  TempSrc:   PainSc: 0-No pain         Complications: No notable events documented.

## 2024-03-23 ENCOUNTER — Encounter (HOSPITAL_COMMUNITY): Payer: Self-pay | Admitting: Orthopedic Surgery

## 2024-03-23 NOTE — Progress Notes (Signed)
 Patient alert and oriented, mae's well, voiding adequate amount of urine, swallowing without difficulty, no c/o pain at time of discharge. Patient discharged home with daughter. Script and discharged instructions given to patient. Patient and daughter stated understanding of instructions given. Room was checked and accounted for all patient's belongings; discharge instructions concerning his medications, incision care, follow up appointment and when to call the doctor as needed were all discussed with patient by RN and she expressed understanding on the instructions given.

## 2024-03-23 NOTE — Progress Notes (Signed)
 PT Cancellation Note  Patient Details Name: Jacqueline Spencer MRN: [id number] DOB: 1951/05/08   Cancelled Treatment:    Reason Eval/Treat Not Completed: PT screened, no needs identified, will sign off. Pt screened, patient with no acute PT needs at this time. OT has seen patient and discussed precautions and report pt to have necessary assist for return home. PT SIGNING OFF. Please re-consult if needed in future.  Agusta Hackenberg, PT, DPT Acute Rehabilitation Services Secure chat preferred Office #: [phone]    [name] Ames 03/23/2024, 9:26 AM

## 2024-03-23 NOTE — Plan of Care (Signed)

## 2024-03-23 NOTE — Evaluation (Signed)
 Occupational Therapy Evaluation Patient Details Name: Jacqueline Spencer MRN: [id number] DOB: 02-10-1951 Today's Date: 03/23/2024   History of Present Illness   73 yo s/p 9/29 L2-3 L3-4 L4-5 PMH arthritis, depression, HTN, sleep apnea     Clinical Impressions Patient evaluated by Occupational Therapy with no further acute OT needs identified. All education has been completed and the patient has no further questions. See below for any follow-up Occupational Therapy or equipment needs. OT to sign off. Thank you for referral.       If plan is discharge home, recommend the following:   Assist for transportation     Functional Status Assessment   Patient has had a recent decline in their functional status and demonstrates the ability to make significant improvements in function in a reasonable and predictable amount of time.     Equipment Recommendations   None recommended by OT     Recommendations for Other Services         Precautions/Restrictions   Precautions Precautions: Back Recall of Precautions/Restrictions: Intact Precaution/Restrictions Comments: back handout provided and reviewed for adsl Required Braces or Orthoses: Spinal Brace Spinal Brace: Lumbar corset;Applied in standing position (added extender due to poor fit) Restrictions Weight Bearing Restrictions Per Provider Order: No     Mobility Bed Mobility Overal bed mobility: Modified Independent             General bed mobility comments: educated on back precautions with good return demo    Transfers Overall transfer level: Modified independent Equipment used: Rolling walker (2 wheels)                      Balance Overall balance assessment: Modified Independent                                         ADL either performed or assessed with clinical judgement   ADL Overall ADL's : Modified independent                                        General ADL Comments: pt able to complete LB dressing with figure 4 cross, pt with (A) to extend the back brace with black extender piece from unit. pt was unable to complete close brace prior. Back handout provided and reviewed adls in detail. Pt educated on: clothing between brace, never sleep in brace, set an alarm at night for medication, avoid sitting for long periods of time, correct bed positioning for sleeping, correct sequence for bed mobility, avoiding lifting more than 5 pounds and never wash directly over incision. All education is complete and patient indicates understanding.      Vision Baseline Vision/History: 1 Wears glasses Vision Assessment?: Wears glasses for reading;No apparent visual deficits     Perception         Praxis         Pertinent Vitals/Pain Pain Assessment Pain Assessment: Faces Faces Pain Scale: Hurts a little bit Pain Location: back Pain Descriptors / Indicators: Operative site guarding Pain Intervention(s): Monitored during session, Premedicated before session, Repositioned, Limited activity within patient's tolerance     Extremity/Trunk Assessment Upper Extremity Assessment Upper Extremity Assessment: Overall WFL for tasks assessed   Lower Extremity Assessment Lower Extremity Assessment: Overall WFL for tasks assessed   Cervical / Trunk Assessment  Cervical / Trunk Assessment: Back Surgery   Communication Communication Communication: No apparent difficulties   Cognition Arousal: Alert Behavior During Therapy: WFL for tasks assessed/performed Cognition: No apparent impairments                               Following commands: Intact       Cueing  General Comments   Cueing Techniques: Gestural cues      Exercises     Shoulder Instructions      Home Living Family/patient expects to be discharged to:: Private residence Living Arrangements: Children Available Help at Discharge: Family (granddaughter 42 yo and  daugther Almarie) Type of Home: House Home Access: Stairs to enter [employer] of Steps: 4 Entrance Stairs-Rails: Can reach both Home Layout: One level;Other (Comment) (step down to living room and bed room)     Bathroom Shower/Tub: Producer, television/film/video: Standard     Home Equipment: Agricultural consultant (2 wheels);Cane - single point;BSC/3in1;Shower seat;Grab bars - toilet;Grab bars - tub/shower;Hand held shower head;Adaptive equipment Adaptive Equipment: Reacher Additional Comments: has 3 dogs and several cats in the home      Prior Functioning/Environment Prior Level of Function : Independent/Modified Independent;Driving                    OT Problem List:     OT Treatment/Interventions:        OT Goals(Current goals can be found in the care plan section)   Acute Rehab OT Goals Patient Stated Goal: to go home Potential to Achieve Goals: Good   OT Frequency:       Co-evaluation              AM-PAC OT 6 Clicks Daily Activity     Outcome Measure Help from another person eating meals?: None Help from another person taking care of personal grooming?: None Help from another person toileting, which includes using toliet, bedpan, or urinal?: None Help from another person bathing (including washing, rinsing, drying)?: None Help from another person to put on and taking off regular upper body clothing?: None Help from another person to put on and taking off regular lower body clothing?: None 6 Click Score: 24   End of Session Equipment Utilized During Treatment: Rolling walker (2 wheels);Back brace Nurse Communication: Mobility status;Precautions  Activity Tolerance: Patient tolerated treatment well Patient left: in bed;with call bell/phone within reach;with family/visitor present  OT Visit Diagnosis: Unsteadiness on feet (R26.81);Muscle weakness (generalized) (M62.81)                Time: 9171-9082 OT Time Calculation (min): 49  min Charges:  OT General Charges $OT Visit: 1 Visit OT Evaluation $OT Eval Moderate Complexity: 1 Mod OT Treatments $Self Care/Home Management : 23-37 mins   Brynn, OTR/L  Acute Rehabilitation Services Office: [phone] .   Ely Molt 03/23/2024, 9:32 AM

## 2024-03-23 NOTE — Discharge Summary (Cosign Needed Addendum)
 Patient ID: Jacqueline Spencer MRN: [id number] DOB/AGE: 73-May-1952 73 y.o.  Admit date: 03/22/2024 Discharge date: 03/23/2024  Admission Diagnoses:  Principal Problem:   Spinal stenosis   Discharge Diagnoses:  Principal Problem:   Spinal stenosis  status post Procedure(s): LUMBAR DECOMPRESSION WITH IN SITU FUSION OF LUMBAR TWO TO LUMBAR THREE, LUMBAR THREE TO LUMBAR FOUR, AND LUMBAR FOUR TO LUMBAR FIVE  Past Medical History:  Diagnosis Date   Arthritis    Depression    High cholesterol    History of bladder surgery    Bladder Sling   Hypertension    Sleep apnea     Surgeries: Procedure(s): LUMBAR DECOMPRESSION WITH IN SITU FUSION OF LUMBAR TWO TO LUMBAR THREE, LUMBAR THREE TO LUMBAR FOUR, AND LUMBAR FOUR TO LUMBAR FIVE on 03/22/2024   Consultants:   Discharged Condition: Improved  Hospital Course: Jacqueline Spencer is an 73 y.o. female who was admitted 03/22/2024 for operative treatment of Spinal stenosis. Patient failed conservative treatments (please see the history and physical for the specifics) and had severe unremitting pain that affects sleep, daily activities and work/hobbies. After pre-op clearance, the patient was taken to the operating room on 03/22/2024 and underwent  Procedure(s): LUMBAR DECOMPRESSION WITH IN SITU FUSION OF LUMBAR TWO TO LUMBAR THREE, LUMBAR THREE TO LUMBAR FOUR, AND LUMBAR FOUR TO LUMBAR FIVE.    Patient was given perioperative antibiotics:  Anti-infectives (From admission, onward)    Start     Dose/Rate Route Frequency Ordered Stop   03/22/24 2000  ceFAZolin  (ANCEF ) IVPB 1 g/50 mL premix        1 g 100 mL/hr over 30 Minutes Intravenous Every 8 hours 03/22/24 1245 03/23/24 0437   03/22/24 1021  vancomycin (VANCOCIN) powder  Status:  Discontinued          As needed 03/22/24 1021 03/22/24 1202   03/22/24 0556  ceFAZolin  (ANCEF ) IVPB 2g/100 mL premix        2 g 200 mL/hr over 30 Minutes Intravenous 30 min pre-op 03/22/24 0556 03/22/24 1142    03/22/24 0556  ceFAZolin  (ANCEF ) IVPB 2g/100 mL premix  Status:  Discontinued        2 g 200 mL/hr over 30 Minutes Intravenous 30 min pre-op 03/22/24 0556 03/22/24 0558        Patient was given sequential compression devices and early ambulation to prevent DVT.   Patient benefited maximally from hospital stay and there were no complications. At the time of discharge, the patient was urinating/moving their bowels without difficulty, tolerating a regular diet, pain is controlled with oral pain medications and they have been cleared by PT/OT.   Recent vital signs: Patient Vitals for the past 24 hrs:  BP Temp Temp src Pulse Resp SpO2  03/23/24 0402 119/87 98.7 F (37.1 C) Oral 97 18 98 %  03/22/24 2019 118/71 98.7 F (37.1 C) Oral 98 18 96 %  03/22/24 1603 118/73 98.2 F (36.8 C) Oral 95 20 97 %  03/22/24 1317 134/64 97.7 F (36.5 C) Oral 84 20 97 %  03/22/24 1245 125/61 98 F (36.7 C) -- 84 18 95 %  03/22/24 1242 122/68 -- -- 88 13 93 %  03/22/24 1230 126/71 -- -- 93 14 96 %  03/22/24 1215 123/62 -- -- 93 18 99 %  03/22/24 1208 (!) 115/50 98.1 F (36.7 C) -- 94 20 100 %     Recent laboratory studies:  Recent Labs    03/22/24 0624  NA 138  K 3.7  CL 102  CO2 25  BUN 15  CREATININE 0.72  GLUCOSE 112*  CALCIUM  9.5     Discharge Medications:   Allergies as of 03/23/2024   No Known Allergies      Medication List     STOP taking these medications    acetaminophen  500 MG tablet Commonly known as: TYLENOL    cyclobenzaprine  10 MG tablet Commonly known as: FLEXERIL    meloxicam  15 MG tablet Commonly known as: MOBIC    methocarbamol 500 MG tablet Commonly known as: ROBAXIN   NYQUIL PO   oxyCODONE  5 MG immediate release tablet Commonly known as: Oxy IR/ROXICODONE    solifenacin 10 MG tablet Commonly known as: VESICARE   Zepbound 2.5 MG/0.5ML Pen Generic drug: tirzepatide       TAKE these medications    DULoxetine 60 MG capsule Commonly known as:  CYMBALTA Take 60 mg by mouth 2 (two) times daily.   gabapentin  300 MG capsule Commonly known as: NEURONTIN  Take 300 mg by mouth 2 (two) times daily. What changed: Another medication with the same name was added. Make sure you understand how and when to take each.   gabapentin  300 MG capsule Commonly known as: Neurontin  Take 1 capsule (300 mg total) by mouth 3 (three) times daily as needed for up to 14 days. What changed: You were already taking a medication with the same name, and this prescription was added. Make sure you understand how and when to take each.   hydrochlorothiazide  25 MG tablet Commonly known as: HYDRODIURIL  Take 25 mg by mouth daily.   omeprazole 40 MG capsule Commonly known as: PRILOSEC Take 40 mg by mouth daily.   ondansetron  4 MG tablet Commonly known as: Zofran  Take 1 tablet (4 mg total) by mouth every 8 (eight) hours as needed for nausea or vomiting.   oxybutynin  5 MG tablet Commonly known as: DITROPAN  Take 10 mg by mouth 2 (two) times daily.   oxyCODONE -acetaminophen  10-325 MG tablet Commonly known as: Percocet Take 1 tablet by mouth every 6 (six) hours as needed for up to 5 days for pain.   rosuvastatin  40 MG tablet Commonly known as: CRESTOR  Take 40 mg by mouth at bedtime.        Diagnostic Studies: DG Lumbar Spine 1 View Result Date: 03/22/2024 CLINICAL DATA:  Elective surgery. EXAM: LUMBAR SPINE - 1 VIEW COMPARISON:  Preoperative imaging FINDINGS: Single portable cross-table spot view of the lumbar spine submitted from the operating room. Surgical instruments localize posteriorly at the L2 and L5 levels. IMPRESSION: Intraoperative localization during lumbar surgery. Electronically Signed   By: Andrea Gasman M.D.   On: 03/22/2024 14:59   DG Lumbar Spine 2-3 Views Result Date: 03/22/2024 CLINICAL DATA:  73 year old female undergoing lumbar surgery. EXAM: LUMBAR SPINE - 2-3 VIEW COMPARISON:  Lumbar radiographs 11/03/2020.  Lumbar MRI  04/23/2023. FINDINGS: Normal segmentation with mildly hypoplastic ribs at T12 demonstrated on the prior radiographs, the same numbering system used on the 2024 MRI. Intraoperative portable cross-table lateral view of the lumbar spine #1 at 0808 hours. Cephalad needle projects over the L2 spinous process. Caudal needle projects posterior to the S1 body. Image #2 at 0821 hours. Posterior surgical probe now in place at the L4-L5 disc space. IMPRESSION: Intraoperative localization of the L4-L5 disc space at 0821 hours. This was discussed with OR personnel assisting Dr. DONACIANO SPRANG on 03/22/2024 at 08:40 . Electronically Signed   By: VEAR Hurst M.D.   On: 03/22/2024 08:41  Follow-up Information     Burnetta Aures, MD. Schedule an appointment as soon as possible for a visit in 2 week(s).   Specialty: Orthopedic Surgery Why: If symptoms worsen, For suture removal, For wound re-check Contact information: [address] [place] [address] [phone]                 Discharge Plan:  discharge to home today   Disposition:   Jacqueline Spencer is a very pleasant 73 year old female who is POD 1 from Lumbar Decompression with In Situ fusion if L2-L5.  Surgical intervention was successful and without complications. Her hospital course has been uncomplicated. She states that her pre-operative leg pain is much improved since surgery. She is ambulating on her own. She is tolerating oral intake well. She is compliant with the LSO brace. She is complaint with the incentive spirometer. She reports that her pain is well controled with oral pain medications. Dressing is c/d/I. Patient will follow up with us  in 2 weeks for her first post-op appointment. She understands that she cannot shower for the first 5 days post-op. All questions were welcomed and addressed.      Signed: Jeoffrey LOISE Sages PA-C for Dr. Aures [facility] [phone] 03/23/2024, 7:34 AM

## 2024-03-26 LAB — TYPE AND SCREEN
ABO/RH(D): O POS
Antibody Screen: POSITIVE
Unit division: 0
Unit division: 0

## 2024-03-26 LAB — BPAM RBC
Blood Product Expiration Date: 202510262359
Blood Product Expiration Date: 202510272359
Unit Type and Rh: 5100
Unit Type and Rh: 5100

## 2024-05-06 ENCOUNTER — Other Ambulatory Visit: Payer: Self-pay | Admitting: Family

## 2024-05-06 DIAGNOSIS — Z1231 Encounter for screening mammogram for malignant neoplasm of breast: Secondary | ICD-10-CM

## 2024-05-14 ENCOUNTER — Ambulatory Visit
Admission: RE | Admit: 2024-05-14 | Discharge: 2024-05-14 | Disposition: A | Discharge status: Home / Self Care | Payer: Medicare PPO | Source: Ambulatory Visit | Attending: Family | Admitting: Family

## 2024-05-14 DIAGNOSIS — Z1231 Encounter for screening mammogram for malignant neoplasm of breast: Secondary | ICD-10-CM
